# Patient Record
Sex: Male | Born: 1970 | Race: Black or African American | Hispanic: No | Marital: Single | Smoking: Never smoker
Health system: Southern US, Community
[De-identification: ages and names within clinical notes are randomized; demographics above are authoritative.]

---

## 1998-09-04 ENCOUNTER — Emergency Department (HOSPITAL_COMMUNITY): Admission: EM | Admit: 1998-09-04 | Discharge: 1998-09-04 | Payer: Self-pay | Admitting: Emergency Medicine

## 1998-09-04 ENCOUNTER — Encounter: Payer: Self-pay | Admitting: Emergency Medicine

## 2004-02-07 ENCOUNTER — Emergency Department (HOSPITAL_COMMUNITY): Admission: EM | Admit: 2004-02-07 | Discharge: 2004-02-08 | Payer: Self-pay | Admitting: Emergency Medicine

## 2004-09-23 ENCOUNTER — Emergency Department (HOSPITAL_COMMUNITY): Admission: EM | Admit: 2004-09-23 | Discharge: 2004-09-23 | Payer: Self-pay | Admitting: Emergency Medicine

## 2006-02-23 ENCOUNTER — Ambulatory Visit: Payer: Self-pay | Admitting: Internal Medicine

## 2006-02-27 ENCOUNTER — Encounter: Admission: RE | Admit: 2006-02-27 | Discharge: 2006-02-27 | Payer: Self-pay | Admitting: Internal Medicine

## 2006-10-15 ENCOUNTER — Encounter: Payer: Self-pay | Admitting: Emergency Medicine

## 2006-10-15 ENCOUNTER — Inpatient Hospital Stay (HOSPITAL_COMMUNITY): Admission: EM | Admit: 2006-10-15 | Discharge: 2006-10-16 | Payer: Self-pay | Admitting: Emergency Medicine

## 2006-10-21 ENCOUNTER — Emergency Department (HOSPITAL_COMMUNITY): Admission: EM | Admit: 2006-10-21 | Discharge: 2006-10-21 | Payer: Self-pay | Admitting: Emergency Medicine

## 2007-06-05 ENCOUNTER — Ambulatory Visit: Payer: Self-pay | Admitting: Internal Medicine

## 2007-06-05 LAB — CONVERTED CEMR LAB
Bilirubin Urine: NEGATIVE
Blood in Urine, dipstick: NEGATIVE
Glucose, Urine, Semiquant: NEGATIVE
Ketones, urine, test strip: NEGATIVE
Nitrite: NEGATIVE
Protein, U semiquant: NEGATIVE
Specific Gravity, Urine: 1.02
Urobilinogen, UA: 0.2
WBC Urine, dipstick: NEGATIVE
pH: 6.5

## 2007-06-06 ENCOUNTER — Encounter: Payer: Self-pay | Admitting: Internal Medicine

## 2007-06-06 LAB — CONVERTED CEMR LAB
Chlamydia, DNA Probe: NEGATIVE
GC Probe Amp, Genital: NEGATIVE

## 2007-09-06 ENCOUNTER — Ambulatory Visit: Payer: Self-pay | Admitting: Family Medicine

## 2007-09-06 LAB — CONVERTED CEMR LAB
Bilirubin Urine: NEGATIVE
Blood in Urine, dipstick: NEGATIVE
Glucose, Urine, Semiquant: NEGATIVE
Ketones, urine, test strip: NEGATIVE
Nitrite: NEGATIVE
Specific Gravity, Urine: 1.02
Urobilinogen, UA: 2
WBC Urine, dipstick: NEGATIVE
pH: 7.5

## 2008-01-08 ENCOUNTER — Ambulatory Visit: Payer: Self-pay | Admitting: Internal Medicine

## 2008-01-08 LAB — CONVERTED CEMR LAB
Bilirubin Urine: NEGATIVE
Nitrite: NEGATIVE
Protein, U semiquant: NEGATIVE
Urobilinogen, UA: NEGATIVE

## 2009-06-26 ENCOUNTER — Ambulatory Visit: Payer: Self-pay | Admitting: Internal Medicine

## 2009-06-26 DIAGNOSIS — F528 Other sexual dysfunction not due to a substance or known physiological condition: Secondary | ICD-10-CM | POA: Insufficient documentation

## 2009-06-26 LAB — CONVERTED CEMR LAB
Bilirubin Urine: NEGATIVE
Glucose, Urine, Semiquant: NEGATIVE
Protein, U semiquant: 30
Urobilinogen, UA: 0.2
WBC Urine, dipstick: NEGATIVE
pH: 5

## 2009-07-02 ENCOUNTER — Ambulatory Visit: Payer: Self-pay | Admitting: Internal Medicine

## 2009-07-20 ENCOUNTER — Encounter: Payer: Self-pay | Admitting: Internal Medicine

## 2009-12-03 ENCOUNTER — Encounter: Payer: Self-pay | Admitting: Internal Medicine

## 2010-07-22 NOTE — Assessment & Plan Note (Signed)
Summary: pt stuck q-tip in left ear/ear discomfort/trouble hearing/njr   Vital Signs:  Patient profile:   40 year old male Weight:      192 pounds Temp:     99.0 degrees F oral BP sitting:   112 / 90  (left arm) Cuff size:   regular  Vitals Entered By: [name], RN (July 02, 2009 10:23 AM) CC: Stuck Q-tip in L ear yesterday and had sharp pain- this morning ear was bleeding and feels clogged up.   CC:  Stuck Q-tip in L ear yesterday and had sharp pain- this morning ear was bleeding and feels clogged up.Ricardo Mitchell  History of Present Illness: 40 year old patient, who presents today complaining of diminished auditory acuity on the left.  Yesterday he is having some difficult with the left ear and tried to remove some wax with a Q-tip.  Since that time his hearing has diminished on the left  Allergies: No Known Drug Allergies  Past History:  Past Medical History: Reviewed history from 06/05/2007 and no changes required. Unremarkable  Physical Exam  General:  Well-developed,well-nourished,in no acute distress; alert,appropriate and cooperative throughout examination Head:  Normocephalic and atraumatic without obvious abnormalities. No apparent alopecia or balding. Eyes:  No corneal or conjunctival inflammation noted. EOMI. Perrla. Funduscopic exam benign, without hemorrhages, exudates or papilledema. Vision grossly normal. Ears:  left canal occluded with cerumen, and a small amount of dried blood ; Weber did lateralize to the left   Impression & Recommendations:  Problem # 1:  CERUMEN IMPACTION, LEFT (ICD-380.4) irrigated until clear  Patient Instructions: 1)  Please schedule a follow-up appointment as needed.

## 2010-07-22 NOTE — Assessment & Plan Note (Signed)
Summary: URINATING ALOT/NJR   Vital Signs:  Patient profile:   40 year old male Weight:      189 pounds Temp:     98.4 degrees F oral BP sitting:   124 / 88  (left arm) Cuff size:   regular  Vitals Entered By: [name], RN (June 26, 2009 8:40 AM) CC: C/o frequency, esp @noc  and some discomfort inside pelvis.   CC:  C/o frequency and esp @noc  and some discomfort inside pelvis.Ricardo Mitchell  History of Present Illness: 40 year old patient who is seen today for follow-up of his urinary frequency, especially nocturia.  He has empirically been treated for prostatitis in the past.  Last visit, he was given a trial of VESIcare and states that he did quite well for a number of months.  His main complaint is nocturia.  Excess fluid intake or caffeinated products do not seem to be an issue.  There has been no urethral discharge, dysuria, or other complaints.  Urinalyses have been normal.  He is also requesting referral to a urologist after being encouraged by his fiance.  He also describes some mild ED issues.  When evaluated last visit for this same complaint.  Prostate exam was performed and was normal  Allergies: No Known Drug Allergies  Past History:  Past Medical History: Reviewed history from 06/05/2007 and no changes required. Unremarkable  Physical Exam  General:  Well-developed,well-nourished,in no acute distress; alert,appropriate and cooperative throughout examination   Impression & Recommendations:  Problem # 1:  FREQUENCY, URINARY (ICD-788.41)  Orders: UA Dipstick w/o Micro (manual) (04540) Urology Referral (Urology) urinalysis is normal.  Patient may have mild overactive bladder.  VESIcare  was quite helpful in the past.  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  A sample pack of Levitra, dispensed  Patient Instructions: 1)  urology referral as discussed 2)  Please schedule a  follow-up appointment as needed.  Laboratory Results   Urine Tests    Routine Urinalysis   Color: yellow Appearance: Clear Glucose: negative   (Normal Range: Negative) Bilirubin: negative   (Normal Range: Negative) Ketone: negative   (Normal Range: Negative) Spec. Gravity: 1.025   (Normal Range: 1.003-1.035) Blood: trace-intact   (Normal Range: Negative) pH: 5.0   (Normal Range: 5.0-8.0) Protein: 30   (Normal Range: Negative) Urobilinogen: 0.2   (Normal Range: 0-1) Nitrite: negative   (Normal Range: Negative) Leukocyte Esterace: negative   (Normal Range: Negative)

## 2010-07-22 NOTE — Consult Note (Signed)
Summary: [facility]   Imported By: Maryln Gottron 07/24/2009 10:53:28  _____________________________________________________________________  External Attachment:    Type:   Image     Comment:   External Document

## 2010-07-22 NOTE — Letter (Signed)
Summary: [facility]  [facility]   Imported By: Maryln Gottron 12/14/2009 14:55:54  _____________________________________________________________________  External Attachment:    Type:   Image     Comment:   External Document
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
## 2011-07-19 ENCOUNTER — Ambulatory Visit (INDEPENDENT_AMBULATORY_CARE_PROVIDER_SITE_OTHER): Payer: PRIVATE HEALTH INSURANCE | Admitting: Family

## 2011-07-19 ENCOUNTER — Encounter: Payer: Self-pay | Admitting: Family

## 2011-07-19 VITALS — BP 120/80 | Temp 99.8°F | Ht 71.0 in | Wt 179.0 lb

## 2011-07-19 DIAGNOSIS — M5380 Other specified dorsopathies, site unspecified: Secondary | ICD-10-CM

## 2011-07-19 DIAGNOSIS — M5416 Radiculopathy, lumbar region: Secondary | ICD-10-CM

## 2011-07-19 DIAGNOSIS — IMO0002 Reserved for concepts with insufficient information to code with codable children: Secondary | ICD-10-CM

## 2011-07-19 DIAGNOSIS — M47816 Spondylosis without myelopathy or radiculopathy, lumbar region: Secondary | ICD-10-CM

## 2011-07-19 MED ORDER — KETOROLAC TROMETHAMINE 60 MG/2ML IM SOLN
60.0000 mg | Freq: Once | INTRAMUSCULAR | Status: AC
  Administered 2011-07-19: 60 mg via INTRAMUSCULAR

## 2011-07-19 MED ORDER — DICLOFENAC SODIUM 75 MG PO TBEC: 75.0000 mg | DELAYED_RELEASE_TABLET | Freq: Two times a day (BID) | ORAL | Status: DC

## 2011-07-19 MED ORDER — HYDROCODONE-ACETAMINOPHEN 5-500 MG PO TABS: 1.0000 | ORAL_TABLET | Freq: Three times a day (TID) | ORAL | Status: AC | PRN

## 2011-07-19 NOTE — Patient Instructions (Addendum)
Sciatica Sciatica is a weakness and/or changes in sensation (tingling, jolts, hot and cold, numbness) along the path the sciatic nerve travels. Irritation or damage to lumbar nerve roots is often also referred to as lumbar radiculopathy.  Lumbar radiculopathy (Sciatica) is the most common form of this problem. Radiculopathy can occur in any of the nerves coming out of the spinal cord. The problems caused depend on which nerves are involved. The sciatic nerve is the large nerve supplying the branches of nerves going from the hip to the toes. It often causes a numbness or weakness in the skin and/or muscles that the sciatic nerve serves. It also may cause symptoms (problems) of pain, burning, tingling, or electric shock-like feelings in the path of this nerve. This usually comes from injury to the fibers that make up the sciatic nerve. Some of these symptoms are low back pain and/or unpleasant feelings in the following areas:  From the mid-buttock down the back of the leg to the back of the knee.   And/or the outside of the calf and top of the foot.   And/or behind the inner ankle to the sole of the foot.  CAUSES   Herniated or slipped disc. Discs are the little cushions between the bones in the back.   Pressure by the piriformis muscle in the buttock on the sciatic nerve (Piriformis Syndrome).   Misalignment of the bones in the lower back and buttocks (Sacroiliac Joint Derangement).   Narrowing of the spinal canal that puts pressure on or pinches the fibers that make up the sciatic nerve.   A slipped vertebra that is out of line with those above or beneath it.   Abnormality of the nervous system itself so that nerve fibers do not transmit signals properly, especially to feet and calves (neuropathy).   Tumor (this is rare).  Your caregiver can usually determine the cause of your sciatica and begin the treatment most likely to help you. TREATMENT  Taking over-the-counter painkillers, physical  therapy, rest, exercise, spinal manipulation, and injections of anesthetics and/or steroids may be used. Surgery, acupuncture, and Yoga can also be effective. Mind over matter techniques, mental imagery, and changing factors such as your bed, chair, desk height, posture, and activities are other treatments that may be helpful. You and your caregiver can help determine what is best for you. With proper diagnosis, the cause of most sciatica can be identified and removed. Communication and cooperation between your caregiver and you is essential. If you are not successful immediately, do not be discouraged. With time, a proper treatment can be found that will make you comfortable. HOME CARE INSTRUCTIONS   If the pain is coming from a problem in the back, applying ice to that area for 15 to 20 minutes, 3 to 4 times per day while awake, may be helpful. Put the ice in a plastic bag. Place a towel between the bag of ice and your skin.   You may exercise or perform your usual activities if these do not aggravate your pain, or as suggested by your caregiver.   Only take over-the-counter or prescription medicines for pain, discomfort, or fever as directed by your caregiver.   If your caregiver has given you a follow-up appointment, it is very important to keep that appointment. Not keeping the appointment could result in a chronic or permanent injury, pain, and disability. If there is any problem keeping the appointment, you must call back to this facility for assistance.  SEEK IMMEDIATE MEDICAL CARE   IF:   You experience loss of control of bowel or bladder.   You have increasing weakness in the trunk, buttocks, or legs.   There is numbness in any areas from the hip down to the toes.   You have difficulty walking or keeping your balance.   You have any of the above, with fever or forceful vomiting.  Document Released: 05/31/2001 Document Revised: 02/16/2011 Document Reviewed: 01/18/2008 [facility] Patient  Information 2012 [place], [place].  Facet Syndrome Facet syndrome is a condition where injury to the small joints between the bones in the spine (facet joints) causes back pain. Over rotation (twisting) or arching (extension) of the back may injure the joints or the soft disks between the spinal bones. Such injuries result in excessive motion of the facet joint. This causes the cartilage covering the facet joint to wear down. That places pressure on nerves, as they exit the spinal cord.  SYMPTOMS   Chronic dull ache in the low back, that gets worse with over-extension and rotation.   Pain in the low back, buttocks, hip, and sometimes leg.   Sometimes, stiffness of the low back.  CAUSES  Facet syndrome is often caused by repeated or over rotation, over-extension, or extension with rotation of the back. These motions cause injury to the cartilage covering the facet joints. This places pressure on the spinal nerves. RISK INCREASES WITH:  Sports that can cause over-extension of the back, with rotation or repeatedly (golf, football, gymnastics, diving, weight-lifting, dancing, rifle shooting, wrestling, tennis, swimming, volleyball, track and field, rugby, other contact sports).   Poor back strength and flexibility.   Poor exercise technique.  PREVENTION   Learn and use proper technique.   Warm up and stretch properly before activity.   Maintain physical fitness:   Back and hamstring flexibility.   Back muscle strength and endurance.   Cardiovascular fitness.  PROGNOSIS  This condition is often resolved with proper non-surgical treatment.  RELATED COMPLICATIONS   Recurring symptoms, resulting in a chronic problem.   Delayed healing, especially if sports are resumed too soon.   Prolonged impairment.   Narrowed canal for the spinal cord, due to bone spurs (bumps) resulting from chronic erosion of the facet joints (spinal stenosis).  TREATMENT  Treatment first involves stopping  activities that aggravate your symptoms. Ice and medicines may be used to reduce pain and inflammation. Your caregiver may advise strength and stretching activities, to be completed at home or with a therapist. You may be referred to a physical therapist for further treatment, including: ultrasound, manual adjustments, transcutaneous electronic nerve stimulation (TENS). Surgery is rarely needed. It is reserved for athletes with persistent pain, despite 6 to 12 months of proper non-surgical treatment. Surgery involves joining (fusing) two bones of the spinal column, to stop motion between the facet joint and disk. MEDICATION   If pain medicine is needed, nonsteroidal anti-inflammatory medicines (aspirin and ibuprofen), or other minor pain relievers (acetaminophen), are often advised.   Do not take pain medicine for 7 days before surgery.   Stronger pain relievers may be prescribed. Use only as directed and only as much as you need.  HEAT AND COLD  Cold treatment (icing) relieves pain and reduces inflammation. Cold treatment should be applied for 10 to 15 minutes every 2 to 3 hours, and immediately after activity that aggravates your symptoms. Use ice packs or an ice massage.   Heat treatment may be used before performing stretching and strengthening activities advised by your caregiver, physical therapist,  or Event organiser. Use a heat pack or a warm water soak.  SEEK MEDICAL CARE IF:   Symptoms get worse or do not improve in 2 to 4 weeks, despite treatment.   You develop numbness, weakness, or loss of bladder or bowel function.   New, unexplained symptoms develop. (Drugs used in treatment may produce side effects.)  Document Released: 06/06/2005 Document Revised: 02/16/2011 Document Reviewed: 09/18/2008 [facility] Patient Information 2012 [place], [place].

## 2011-07-19 NOTE — Progress Notes (Signed)
  Subjective:    Patient ID: Ricardo Mitchell, male    DOB: 06-24-1970, 41 y.o.   MRN: [id number]  HPI 41 year old [place] American male, for here today with complaint of low back pain that occurred suddenly yesterday while he was sitting at his desk typing. He describes the pain as a constant ache, that is worse with movement. With movement the pain is 8/10. Without movement to 2/10. The pain radiates down into his right. Denies any past medical history of low back pain or injury. He denies any frequency or urgency to urinate, no loss of bowel or bladder control, no fever.   Review of Systems  Respiratory: Negative.   Cardiovascular: Negative.   Gastrointestinal: Negative.   Genitourinary: Negative.   Musculoskeletal: Positive for back pain.       Low back pain that radiates to the right leg.   Neurological: Negative.   Psychiatric/Behavioral: Negative.    No past medical history on file.  History   Social History  . Marital Status: Single    Spouse Name: N/A    Number of Children: N/A  . Years of Education: N/A   Occupational History  . Not on file.   Social History Main Topics  . Smoking status: Never Smoker   . Smokeless tobacco: Not on file  . Alcohol Use: Yes     occassionally  . Drug Use: No  . Sexually Active: Not on file   Other Topics Concern  . Not on file   Social History Narrative  . No narrative on file    No past surgical history on file.  Family History  Problem Relation Age of Onset  . Heart attack Father     No Known Allergies  No current outpatient prescriptions on file prior to visit.   No current facility-administered medications on file prior to visit.    BP 120/80  Temp(Src) 99.8 F (37.7 C) (Oral)  Ht 5\' 11"  (1.803 m)  Wt 179 lb (81.194 kg)  BMI 24.97 kg/m2chart    Objective:   Physical Exam  Constitutional: He is oriented to person, place, and time. He appears well-developed and well-nourished.  Neck: Normal range of  motion. Neck supple.  Cardiovascular: Normal rate, regular rhythm and normal heart sounds.   Pulmonary/Chest: Effort normal and breath sounds normal.  Abdominal: Soft. Bowel sounds are normal.  Musculoskeletal: He exhibits no edema and no tenderness.       No pain to palpation of the L-spine. Pain elicited with left lateral movement. No pain with right lateral movement. Negative straight leg raise maneuver. Pedal pulses 2 out of 2  Neurological: He is alert and oriented to person, place, and time.  Skin: Skin is warm and dry.  Psychiatric: He has a normal mood and affect.          Assessment & Plan:  Assessment: Lumbar radiculopathy, acute facet syndrome  Plan: Toradol 60 mg IM x1 given. Vicodin 5/500 one tablet every 8 hours when necessary pain. Warned of drowsiness. Both parents 75 mg one tablet twice a day with food. Heating pad to the affected area. Call the office if symptoms worsen or persist. Check a schedule, and when necessary.

## 2012-03-25 ENCOUNTER — Encounter (HOSPITAL_COMMUNITY): Payer: Self-pay

## 2012-03-25 ENCOUNTER — Emergency Department (HOSPITAL_COMMUNITY)
Admission: EM | Admit: 2012-03-25 | Discharge: 2012-03-25 | Disposition: A | Discharge status: Home / Self Care | Payer: Self-pay | Attending: Emergency Medicine | Admitting: Emergency Medicine

## 2012-03-25 DIAGNOSIS — R319 Hematuria, unspecified: Secondary | ICD-10-CM | POA: Insufficient documentation

## 2012-03-25 LAB — URINALYSIS, ROUTINE W REFLEX MICROSCOPIC
Nitrite: NEGATIVE
Specific Gravity, Urine: 1.039 — ABNORMAL HIGH (ref 1.005–1.030)
pH: 5.5 (ref 5.0–8.0)

## 2012-03-25 LAB — URINE MICROSCOPIC-ADD ON

## 2012-03-25 LAB — CK: Total CK: 281 U/L — ABNORMAL HIGH (ref 7–232)

## 2012-03-25 LAB — POCT I-STAT, CHEM 8
Calcium, Ion: 1.16 mmol/L (ref 1.12–1.23)
Glucose, Bld: 119 mg/dL — ABNORMAL HIGH (ref 70–99)
HCT: 46 % (ref 39.0–52.0)
Hemoglobin: 15.6 g/dL (ref 13.0–17.0)

## 2012-03-25 LAB — CBC
Hemoglobin: 14.9 g/dL (ref 13.0–17.0)
RBC: 4.84 MIL/uL (ref 4.22–5.81)

## 2012-03-25 MED ORDER — CIPROFLOXACIN HCL 500 MG PO TABS: 500.0000 mg | ORAL_TABLET | Freq: Two times a day (BID) | ORAL | Status: DC

## 2012-03-25 NOTE — ED Notes (Signed)
Pt states that he noticed blood in his urine that started today. Pt denies hx of same symptoms. Pt states some clots as well for his urine. Pt denies discharge, pt burning or frequency.

## 2012-03-25 NOTE — ED Provider Notes (Signed)
History     CSN: [id number]  Arrival date & time 03/25/12  4098   First MD Initiated Contact with Patient 03/25/12 0413      Chief Complaint  Patient presents with  . Hematuria    (Consider location/radiation/quality/duration/timing/severity/associated sxs/prior treatment) Patient is a 41 y.o. male presenting with hematuria.  Hematuria Pertinent negatives include no abdominal pain, chills, dysuria or fever.   Hx per PT. Onset this am. No dysuria, no testicle pain, no flank pain. No h/o same. Is sexually active. No rash or lesion or discharge. No F/C. No N/V/D. No ABD pain. Multiple episodes of hematuria unchanged.  Moderate in severity. Non smoker, no medications, no FH of same.  History reviewed. No pertinent past medical history.  History reviewed. No pertinent past surgical history.  Family History  Problem Relation Age of Onset  . Heart attack Father     History  Substance Use Topics  . Smoking status: Never Smoker   . Smokeless tobacco: Not on file  . Alcohol Use: Yes     occassionally      Review of Systems  Constitutional: Negative for fever and chills.  HENT: Negative for neck pain and neck stiffness.   Eyes: Negative for pain.  Respiratory: Negative for shortness of breath.   Cardiovascular: Negative for chest pain.  Gastrointestinal: Negative for abdominal pain.  Genitourinary: Positive for hematuria. Negative for dysuria.  Musculoskeletal: Negative for back pain.  Skin: Negative for rash.  Neurological: Negative for headaches.  All other systems reviewed and are negative.    Allergies  Review of patient's allergies indicates no known allergies.  Home Medications  No current outpatient prescriptions on file.  BP 144/87  Pulse 97  Temp 98.8 F (37.1 C) (Oral)  SpO2 96%  Physical Exam  Constitutional: He is oriented to person, place, and time. He appears well-developed and well-nourished.  HENT:  Head: Normocephalic and atraumatic.    Mouth/Throat: No oropharyngeal exudate.  Eyes: EOM are normal. Pupils are equal, round, and reactive to light.  Neck: Neck supple.  Cardiovascular: Normal rate, regular rhythm and intact distal pulses.   Pulmonary/Chest: Effort normal and breath sounds normal. No respiratory distress.  Abdominal: Soft. Bowel sounds are normal. He exhibits no distension. There is no tenderness. There is no rebound and no guarding.       No CVAT  Musculoskeletal: Normal range of motion. He exhibits no edema and no tenderness.  Neurological: He is alert and oriented to person, place, and time.  Skin: Skin is warm and dry.    ED Course  Procedures (including critical care time)  Results for orders placed during the hospital encounter of 03/25/12  URINALYSIS, ROUTINE W REFLEX MICROSCOPIC      Component Value Range   Color, Urine AMBER (*) YELLOW   APPearance TURBID (*) CLEAR   Specific Gravity, Urine 1.039 (*) 1.005 - 1.030   pH 5.5  5.0 - 8.0   Glucose, UA NEGATIVE  NEGATIVE mg/dL   Hgb urine dipstick SMALL (*) NEGATIVE   Bilirubin Urine NEGATIVE  NEGATIVE   Ketones, ur NEGATIVE  NEGATIVE mg/dL   Protein, ur NEGATIVE  NEGATIVE mg/dL   Urobilinogen, UA 1.0  0.0 - 1.0 mg/dL   Nitrite NEGATIVE  NEGATIVE   Leukocytes, UA TRACE (*) NEGATIVE  CBC      Component Value Range   WBC 6.5  4.0 - 10.5 K/uL   RBC 4.84  4.22 - 5.81 MIL/uL   Hemoglobin 14.9  13.0 -  17.0 g/dL   HCT 08.6  57.8 - 46.9 %   MCV 89.7  78.0 - 100.0 fL   MCH 30.8  26.0 - 34.0 pg   MCHC 34.3  30.0 - 36.0 g/dL   RDW 62.9  52.8 - 41.3 %   Platelets 247  150 - 400 K/uL  CK      Component Value Range   Total CK 281 (*) 7 - 232 U/L  URINE MICROSCOPIC-ADD ON      Component Value Range   Squamous Epithelial / LPF FEW (*) RARE   WBC, UA 3-6  <3 WBC/hpf   RBC / HPF 3-6  <3 RBC/hpf   Bacteria, UA FEW (*) RARE   Urine-Other MUCOUS PRESENT    POCT I-STAT, CHEM 8      Component Value Range   Sodium 143  135 - 145 mEq/L   Potassium 3.4  (*) 3.5 - 5.1 mEq/L   Chloride 104  96 - 112 mEq/L   BUN 11  6 - 23 mg/dL   Creatinine, Ser 2.44  0.50 - 1.35 mg/dL   Glucose, Bld 010 (*) 70 - 99 mg/dL   Calcium, Ion 2.72  5.36 - 1.23 mmol/L   TCO2 24  0 - 100 mmol/L   Hemoglobin 15.6  13.0 - 17.0 g/dL   HCT 64.4  03.4 - 74.2 %   Painless hematuria possible infection - plan ABx and close outpatient follow up. U Cx ordered/ pending.   MDM   Hematuria with UA reviewed as above - bacteria and a few WBCs, no sig RBCs. PT denies any testicle, GU, flank, back or ABD pain. No fevers or h/o prostate problems. Rx cipro. Infection precautions verbalized as understood.         Sunnie Nielsen, MD 03/26/12 [id number]

## 2012-03-25 NOTE — ED Notes (Signed)
Pt complains of blood in urine onset today no associtated pain

## 2012-03-26 LAB — URINE CULTURE: Colony Count: NO GROWTH

## 2012-07-06 ENCOUNTER — Ambulatory Visit (INDEPENDENT_AMBULATORY_CARE_PROVIDER_SITE_OTHER): Payer: BC Managed Care – PPO | Admitting: Internal Medicine

## 2012-07-06 ENCOUNTER — Encounter: Payer: Self-pay | Admitting: Internal Medicine

## 2012-07-06 VITALS — BP 150/88 | HR 76 | Temp 98.7°F | Resp 16 | Wt 197.0 lb

## 2012-07-06 DIAGNOSIS — M25562 Pain in left knee: Secondary | ICD-10-CM

## 2012-07-06 DIAGNOSIS — M25569 Pain in unspecified knee: Secondary | ICD-10-CM

## 2012-07-06 MED ORDER — CELECOXIB 200 MG PO CAPS: 200.0000 mg | ORAL_CAPSULE | Freq: Two times a day (BID) | ORAL | Status: DC

## 2012-07-06 NOTE — Progress Notes (Signed)
  Subjective:    Patient ID: Ricardo Mitchell, male    DOB: 1971-03-19, 42 y.o.   MRN: [id number]  HPI  42 year old patient who presents with a one-month history of left knee pain and swelling. There has been no obvious trauma. Earlier the patient used Aleve with a very nice clinical response his pain level decreased from 3 down to level I with much improvement of the swelling. At the present time he complains of some mild discomfort and knee tightness. No unusual activities and no history of trauma. No prior history of arthritis or joint pain. He takes no chronic medications.  No past medical history on file.  History   Social History  . Marital Status: Single    Spouse Name: N/A    Number of Children: N/A  . Years of Education: N/A   Occupational History  . Not on file.   Social History Main Topics  . Smoking status: Never Smoker   . Smokeless tobacco: Not on file  . Alcohol Use: Yes     Comment: occassionally  . Drug Use: No  . Sexually Active: Not on file   Other Topics Concern  . Not on file   Social History Narrative  . No narrative on file    No past surgical history on file.  Family History  Problem Relation Age of Onset  . Heart attack Father     No Known Allergies  No current outpatient prescriptions on file prior to visit.    BP 150/88  Pulse 76  Temp 98.7 F (37.1 C) (Oral)  Resp 16  Wt 197 lb (89.359 kg)       Review of Systems  Musculoskeletal: Positive for joint swelling and arthralgias.       Objective:   Physical Exam  Constitutional: He appears well-developed and well-nourished. No distress.       Afebrile  Musculoskeletal:        The left knee was slightly warm to touch with a mild effusion. There is mild tenderness along the lateral joint line          Assessment & Plan:   Left knee pain. Concern about a possible left lateral meniscal tear. The patient has had a very nice response to Aleve;  will treat with Celebrex for 2  weeks and observe. If pain and swelling continues to be an issue or in any way affects his level of activities, he has been instructed to call the office for orthopedic referral

## 2012-07-06 NOTE — Patient Instructions (Addendum)
You  may move around, but avoid painful motions and activities.  Apply ice to the sore area for 15 to 20 minutes  after vigorous  Activities  Call or return to clinic prn if these symptoms worsen or fail to improve as anticipated.

## 2012-08-04 ENCOUNTER — Other Ambulatory Visit: Payer: Self-pay

## 2013-01-14 ENCOUNTER — Encounter: Payer: Self-pay | Admitting: Family Medicine

## 2013-01-14 ENCOUNTER — Ambulatory Visit (INDEPENDENT_AMBULATORY_CARE_PROVIDER_SITE_OTHER): Payer: BC Managed Care – PPO | Admitting: Family Medicine

## 2013-01-14 VITALS — BP 130/86 | Temp 99.0°F | Wt 197.0 lb

## 2013-01-14 DIAGNOSIS — R319 Hematuria, unspecified: Secondary | ICD-10-CM

## 2013-01-14 DIAGNOSIS — Z23 Encounter for immunization: Secondary | ICD-10-CM

## 2013-01-14 DIAGNOSIS — R310 Gross hematuria: Secondary | ICD-10-CM

## 2013-01-14 LAB — CBC WITH DIFFERENTIAL/PLATELET
Eosinophils Relative: 1.2 % (ref 0.0–5.0)
HCT: 46.7 % (ref 39.0–52.0)
Lymphocytes Relative: 55.7 % — ABNORMAL HIGH (ref 12.0–46.0)
Lymphs Abs: 2.5 10*3/uL (ref 0.7–4.0)
Monocytes Relative: 9.7 % (ref 3.0–12.0)
Neutrophils Relative %: 32.9 % — ABNORMAL LOW (ref 43.0–77.0)
Platelets: 234 10*3/uL (ref 150.0–400.0)
WBC: 4.4 10*3/uL — ABNORMAL LOW (ref 4.5–10.5)

## 2013-01-14 LAB — POCT URINALYSIS DIPSTICK
Ketones, UA: NEGATIVE
Leukocytes, UA: NEGATIVE
Protein, UA: NEGATIVE

## 2013-01-14 LAB — BASIC METABOLIC PANEL
BUN: 9 mg/dL (ref 6–23)
CO2: 27 mEq/L (ref 19–32)
Calcium: 9.1 mg/dL (ref 8.4–10.5)
Creatinine, Ser: 1.3 mg/dL (ref 0.4–1.5)
Glucose, Bld: 89 mg/dL (ref 70–99)

## 2013-01-14 NOTE — Patient Instructions (Signed)
-  We have ordered labs or studies at this visit. It can take up to 1-2 weeks for results and processing. We will contact you with instructions IF your results are abnormal. Normal results will be released to your [facility]. If you have not heard from [place] or can not find your results in [facility] in 2 weeks please contact our office.  --We placed a referral for you as discussed to the urologist. It usually takes about 1-2 weeks to process and schedule this referral. If you have not heard from [place] regarding this appointment in 2 weeks please contact our office.  -follow up as needed

## 2013-01-14 NOTE — Progress Notes (Signed)
Chief Complaint  Patient presents with  . Hematuria    HPI:  42 yo M pt of Dr. Cato Mulligan here for hematuria: -gross hematuria - small amount this am -has notice darker urine in the past on and off -urine dip with blood, otherwise negative -per review of chart hx of painless hematuria in 10/13 and seen in ED and tx fr possible infection - urine culture was negative -denies:pain, flank pain, nausea, vomiting, frequency, urgency, testicular pain, dysuria, fevers, weight loss -no strenuous exercise, no FH or personal hx kidney stones or kidney problems, reports brother has bladder cancer and he is worried about this and wants to see a urologist -he has no smoking history -not taking any medications  ROS: See pertinent positives and negatives per HPI.  No past medical history on file.  Family History  Problem Relation Age of Onset  . Heart attack Father     History   Social History  . Marital Status: Single    Spouse Name: N/A    Number of Children: N/A  . Years of Education: N/A   Social History Main Topics  . Smoking status: Never Smoker   . Smokeless tobacco: None  . Alcohol Use: Yes     Comment: occassionally  . Drug Use: No  . Sexually Active: None   Other Topics Concern  . None   Social History Narrative  . None    Current outpatient prescriptions:celecoxib (CELEBREX) 200 MG capsule, Take 1 capsule (200 mg total) by mouth 2 (two) times daily., Disp: , Rfl:   EXAM:  Filed Vitals:   01/14/13 0919  BP: 130/86  Temp: 99 F (37.2 C)    Body mass index is 27.49 kg/(m^2).  GENERAL: vitals reviewed and listed above, alert, oriented, appears well hydrated and in no acute distress  HEENT: atraumatic, conjunttiva clear, no obvious abnormalities on inspection of external nose and ears  NECK: no obvious masses on inspection  LUNGS: clear to auscultation bilaterally, no wheezes, rales or rhonchi, good air movement  CV: HRRR, no peripheral edema  GU: normal  appearance of testicles, penis - no lesions, masses or tenderness, he deferred rectal exam and will see urologist for this  ABD: soft, NTTP, no CVA TTP  MS: moves all extremities without noticeable abnormality  PSYCH: pleasant and cooperative, no obvious depression or anxiety  ASSESSMENT AND PLAN:  Discussed the following assessment and plan:  Hematuria - Plan: POCT urinalysis dipstick, CBC with Differential, Ambulatory referral to Urology, Urine Microscopic Only, Culture, Urine, Basic metabolic panel  Need for prophylactic vaccination with combined diphtheria-tetanus-pertussis (DTP) vaccine - Plan: Tdap vaccine greater than or equal to 7yo IM  Gross hematuria - Plan: CBC with Differential, Ambulatory referral to Urology, Urine Microscopic Only, Culture, Urine, Basic metabolic panel  -painless, recurrent gross hematuria and dip with 3+ blood and otherwise negative -discussed potential implications/etiologies and hewould like to see urology - referral placed -Recommendations per orders and instructions, risks and  and return precautions discussed.  -Patient advised to return or notify a doctor immediately if symptoms worsen or persist or new concerns arise.  There are no Patient Instructions on file for this visit.   Kriste Basque R.

## 2013-01-15 LAB — URINALYSIS, MICROSCOPIC ONLY
Casts: NONE SEEN
Crystals: NONE SEEN
RBC / HPF: >50 RBC/hpf — AB (ref <3)
Squamous Epithelial / LPF: NONE SEEN

## 2013-01-15 NOTE — Progress Notes (Signed)
Quick Note:  Called and spoke with pt and pt is aware. ______ 

## 2013-01-16 NOTE — Progress Notes (Signed)
Quick Note:  Called and spoke with pt and pt is aware. ______ 

## 2013-04-25 ENCOUNTER — Other Ambulatory Visit: Payer: Self-pay

## 2013-10-28 ENCOUNTER — Emergency Department (HOSPITAL_COMMUNITY): Payer: 59

## 2013-10-28 ENCOUNTER — Telehealth: Payer: Self-pay | Admitting: Internal Medicine

## 2013-10-28 ENCOUNTER — Emergency Department (HOSPITAL_COMMUNITY)
Admission: EM | Admit: 2013-10-28 | Discharge: 2013-10-28 | Disposition: A | Discharge status: Home / Self Care | Payer: 59 | Attending: Emergency Medicine | Admitting: Emergency Medicine

## 2013-10-28 ENCOUNTER — Encounter (HOSPITAL_COMMUNITY): Payer: Self-pay | Admitting: Emergency Medicine

## 2013-10-28 DIAGNOSIS — R6884 Jaw pain: Secondary | ICD-10-CM | POA: Insufficient documentation

## 2013-10-28 DIAGNOSIS — Z9889 Other specified postprocedural states: Secondary | ICD-10-CM | POA: Insufficient documentation

## 2013-10-28 DIAGNOSIS — R259 Unspecified abnormal involuntary movements: Secondary | ICD-10-CM | POA: Insufficient documentation

## 2013-10-28 DIAGNOSIS — K089 Disorder of teeth and supporting structures, unspecified: Secondary | ICD-10-CM | POA: Insufficient documentation

## 2013-10-28 MED ORDER — KETOROLAC TROMETHAMINE 30 MG/ML IJ SOLN
30.0000 mg | Freq: Once | INTRAMUSCULAR | Status: AC
  Administered 2013-10-28: 30 mg via INTRAMUSCULAR
  Filled 2013-10-28: qty 1

## 2013-10-28 MED ORDER — DIAZEPAM 5 MG/ML IJ SOLN
5.0000 mg | Freq: Once | INTRAMUSCULAR | Status: AC
  Administered 2013-10-28: 5 mg via INTRAMUSCULAR
  Filled 2013-10-28: qty 2

## 2013-10-28 MED ORDER — DIAZEPAM 5 MG PO TABS: 5.0000 mg | ORAL_TABLET | Freq: Two times a day (BID) | ORAL | Status: DC

## 2013-10-28 MED ORDER — HYDROCODONE-ACETAMINOPHEN 5-325 MG PO TABS: 1.0000 | ORAL_TABLET | Freq: Four times a day (QID) | ORAL | Status: DC | PRN

## 2013-10-28 NOTE — ED Provider Notes (Signed)
CSN: [id number]     Arrival date & time 10/28/13  1142 History  This chart was scribed for non-physician practitioner working with Glynn OctaveStephen Rancour, MD, by Jarvis Morganaylor Ferguson, ED Scribe. This patient was seen in room TR08C/TR08C and the patient's care was started at 12:19 PM.    Chief Complaint  Patient presents with  . Jaw Pain    The history is provided by the patient. No language interpreter was used.   HPI Comments: Ricardo Mitchell is a 43 y.o. male who presents to the Emergency Department complaining of waxing and waning, "2/10", tight, jaw pain. Pt had a root canal procedure performed on 10/08/13 and on 10/11/13 he states that he noticed difficulty opening his mouth accompanied by jaw pain. Patient states that his mouth was open for 2 hours during the procedure. Patient states that he followed up with his dentist on 10/16/13 and has been given different rounds of antibiotics, muscle relaxers and antiinflammatories with minimal relief. Pain reports that the pain exacerbated by trying to open his mouth. Patient notes that he has been unable to eat any solid foods and has "lost 9 pounds" since the procedure. Patient has no history of cardiac problems. Patient last T-dap was administered on 01/14/13. Patient denies any drainage from teeth, fever, chest pain, dyspnea, or trouble swallowing.   History reviewed. No pertinent past medical history. History reviewed. No pertinent past surgical history. Family History  Problem Relation Age of Onset  . Heart attack Father    History  Substance Use Topics  . Smoking status: Never Smoker   . Smokeless tobacco: Not on file  . Alcohol Use: Yes     Comment: occassionally    Review of Systems  Constitutional: Negative for fever. Appetite change: unable to eat solids.  HENT: Positive for dental problem (jaw pain and trouble opening mouth). Negative for sore throat, trouble swallowing and voice change.   Respiratory: Negative for shortness of breath.    Cardiovascular: Negative for chest pain.  All other systems reviewed and are negative.     Allergies  Review of patient's allergies indicates no known allergies.  Home Medications   Prior to Admission medications   Not on File   Triage Vitals: BP 128/99  Pulse 97  Temp(Src) 99.3 F (37.4 C) (Oral)  Resp 16  SpO2 97%  Physical Exam  Constitutional: He is oriented to person, place, and time. He appears well-developed and well-nourished. No distress.  HENT:  Head: Normocephalic and atraumatic.  Right Ear: Hearing, tympanic membrane, external ear and ear canal normal.  Left Ear: Hearing, tympanic membrane, external ear and ear canal normal.  Nose: Nose normal.  Mouth/Throat: Uvula is midline, oropharynx is clear and moist and mucous membranes are normal. No oral lesions. There is trismus in the jaw. No dental abscesses or uvula swelling.  Left TMJ clicks when attempting to open jaw. No gross bony deformity noted. No submental or sublingual swelling or edema.    Eyes: Conjunctivae are normal.  Neck: Normal range of motion. Neck supple.  Cardiovascular: Normal rate, regular rhythm and normal heart sounds.   Pulmonary/Chest: Effort normal and breath sounds normal.  Lymphadenopathy:    He has no cervical adenopathy.  Neurological: He is alert and oriented to person, place, and time.  Skin: Skin is warm and dry. He is not diaphoretic.  Psychiatric: He has a normal mood and affect.    ED Course  Procedures (including critical care time) Medications  ketorolac (TORADOL) 30 MG/ML injection  30 mg (30 mg Intramuscular Given 10/28/13 1345)  diazepam (VALIUM) injection 5 mg (5 mg Intramuscular Given 10/28/13 1345)     DIAGNOSTIC STUDIES: Oxygen Saturation is 97% on RA, adequate by my interpretation.    COORDINATION OF CARE:    Labs Review Labs Reviewed - No data to display  Imaging Review Ct Maxillofacial Wo Cm  10/28/2013   CLINICAL DATA:  Jaw pain  EXAM: CT  MAXILLOFACIAL WITHOUT CONTRAST  TECHNIQUE: Multidetector CT imaging of the maxillofacial structures was performed. Multiplanar CT image reconstructions were also generated. A small metallic BB was placed on the right temple in order to reliably differentiate right from left.  COMPARISON:  10/21/2006  FINDINGS: The mandible is intact. The temporomandibular joints are located. No evidence for maxillary sinus fracture. The nasal bones are intact. The zygomatic arches are normal. No air-fluid levels in the paranasal sinuses. No fluid in the mastoid air cells or middle ears. Small focus of polypoid mucosal disease is identified in the right maxillary sinus.  The medial and inferior orbital walls are intact.  IMPRESSION: No acute findings. No evidence to explain the patient's history of jaw pain.   Electronically Signed   By: Kennith CenterEric  Mansell M.D.   On: 10/28/2013 15:23     EKG Interpretation None      MDM   Final diagnoses:  Jaw pain   Filed Vitals:   10/28/13 1536  BP: 128/84  Pulse: 80  Temp: 98.4 F (36.9 C)  Resp: 18   Afebrile, NAD, non-toxic appearing, AAOx4.   Discussed with Dr. Manus Gunningancour who recommends CT maxillofacial for evaluation of trismus.   Patient is a 43 year old male presents to the emergency department for left-sided jaw pain since April 22nd after having a dental procedure done. Lungs are clear on auscultation. Left TMJ clicks and is tender to palpation when patient attempts to open his mouth. There is no sublingual or submental swelling or induration noted. No obvious dental abscess or gross oropharyngeal swelling appreciated. CT maxillofacial obtained without acute finding. Patient endorses improvement after IM Valium and Toradol. Advised he should followup with his dentist sooner than scheduled follow up appointment if at all possible. Return precautions discussed. Patient is agreeable to plan. Patient is stable at time of discharge. Patient d/w with Dr. Manus Gunningancour, agrees with  plan.    I personally performed the services described in this documentation, which was scribed in my presence. The recorded information has been reviewed and is accurate.     Ricardo EllisJennifer L Antoria Lanza, PA-C 10/28/13 1619

## 2013-10-28 NOTE — Telephone Encounter (Signed)
Patient Information:  Caller Name: Ethelene Brownsnthony  Phone: [phone]  Patient: Ricardo Mitchell, Ricardo Mitchell Doctornthony R  Gender: Male  DOB: 01-04-1971  Age: 43 Years  PCP: Eleonore ChiquitoKwiatkowski, Peter (Family Practice > 45yrs old)  Office Follow Up:  Does the office need to follow up with this patient?: No  Instructions For The Office: N/A  RN Note:  Triaged per Teeth and Jaw Symptoms Guideline ([facility]); See in ED Immed due to unable to open/close mouth fully; office called and instructed to send to ED; pt [facility] comply [facility]  Symptoms  Reason For Call & Symptoms: Pt is calling and states that he had a root canal 3 weeks ago and mouth was opened for 2 hours for proceedure and now he is having jaw spasms on the left side;  sx started approx 2 1/2 weeks ago; have not been able to open mouth for 2 1/2 weeks; saw dentist about this and a muscle relaxer and pain pill was given but did not help;  lost 8lb since this has occurred;  eating smoothies for nutritions; seeing chiropractor for this and may have helped some but hard to tell;  Reviewed Health History In EMR: Yes  Reviewed Medications In EMR: Yes  Reviewed Allergies In EMR: Yes  Reviewed Surgeries / Procedures: Yes  Date of Onset of Symptoms: 10/09/2013  Treatments Tried: heating pad; muscle relaxer; pain meds; chiropractor  Treatments Tried Worked: No  Guideline(s) Used:  No Protocol Available - Sick Adult  Disposition Per Guideline:   Go to ED Now  Reason For Disposition Reached:   Nursing judgment  Advice Given:  Call Back If:  New symptoms develop  You become worse.  Patient Will Follow Care Advice:  YES

## 2013-10-28 NOTE — ED Notes (Signed)
Per pt sts that he had a dental procedure on April the 21st and since as had issues opening his mouth. sts that his mouth was open for about 2 hours for the procedure. sts he has been taking anti inflammatory and abx. sts only painful when he tried to open his mouth.

## 2013-10-28 NOTE — Discharge Instructions (Signed)
Please follow up with your primary care physician in 1-2 days. If you do not have one please call the [facility] number listed above. Please take pain medication and/or muscle relaxants as prescribed and as needed for pain. Please do not drive on narcotic pain medication or on muscle relaxants. Please rotate use of these medications, please do not take them at the same time. Please read all discharge instructions and return precautions.   Temporomandibular Problems  Temporomandibular joint (TMJ) dysfunction means there are problems with the joint between your jaw and your skull. This is a joint lined by cartilage like other joints in your body but also has a small disc in the joint which keeps the bones from rubbing on each other. These joints are like other joints and can get inflamed (sore) from arthritis and other problems. When this joint gets sore, it can cause headaches and pain in the jaw and the face. CAUSES  Usually the arthritic types of problems are caused by soreness in the joint. Soreness in the joint can also be caused by overuse. This may come from grinding your teeth. It may also come from mis-alignment in the joint. DIAGNOSIS Diagnosis of this condition can often be made by history and exam. Sometimes your caregiver may need X-rays or an MRI scan to determine the exact cause. It may be necessary to see your dentist to determine if your teeth and jaws are lined up correctly. TREATMENT  Most of the time this problem is not serious; however, sometimes it can persist (become chronic). When this happens medications that will cut down on inflammation (soreness) help. Sometimes a shot of cortisone into the joint will be helpful. If your teeth are not aligned it may help for your dentist to make a splint for your mouth that can help this problem. If no physical problems can be found, the problem may come from tension. If tension is found to be the cause, biofeedback or  relaxation techniques may be helpful. HOME CARE INSTRUCTIONS   Later in the day, applications of ice packs may be helpful. Ice can be used in a plastic bag with a towel around it to prevent frostbite to skin. This may be used about every 2 hours for 20 to 30 minutes, as needed while awake, or as directed by your caregiver.  Only take over-the-counter or prescription medicines for pain, discomfort, or fever as directed by your caregiver.  If physical therapy was prescribed, follow your caregiver's directions.  Wear mouth appliances as directed if they were given. Document Released: 03/01/2001 Document Revised: 08/29/2011 Document Reviewed: 06/08/2008 [facility] Patient Information 2014 [place] [place].

## 2013-10-28 NOTE — ED Provider Notes (Addendum)
Medical screening examination/treatment/procedure(s) were conducted as a shared visit with non-physician practitioner(s) and myself.  I personally evaluated the patient during the encounter.  L jaw pain after dental procedure 4/21. No fever, drooling, voice change.  Trismus on exam, floor of mouth soft, no tongue elevation. Pain at TMJ with attempted opening with masseter spasm. Tetanus up to date.   Symptoms started after prolonged mouth opening for dental procedure. Improvement with benzos in ED. No TMJ dislocation on CT. Tetanus considered but seems less likely given time course and correlation with dental work.  Addendum 10/30/13 9:00 am: called patient. Symptoms have improved with meds and ROM of jaw is improved. Advised patient to have recheck before end of week with PCP or ED to ensure ROM of jaw continues to improve and no other muscle groups affected.   EKG Interpretation None       Glynn OctaveStephen Kaena Santori, MD 10/28/13 1803  Glynn OctaveStephen Alucard Fearnow, MD 10/30/13 [phone]

## 2013-10-31 ENCOUNTER — Ambulatory Visit (INDEPENDENT_AMBULATORY_CARE_PROVIDER_SITE_OTHER): Payer: 59 | Admitting: Family Medicine

## 2013-10-31 ENCOUNTER — Encounter: Payer: Self-pay | Admitting: Family Medicine

## 2013-10-31 VITALS — BP 112/72 | HR 88 | Temp 98.4°F | Ht 71.0 in | Wt 195.0 lb

## 2013-10-31 DIAGNOSIS — M26629 Arthralgia of temporomandibular joint, unspecified side: Secondary | ICD-10-CM

## 2013-10-31 DIAGNOSIS — M2669 Other specified disorders of temporomandibular joint: Secondary | ICD-10-CM

## 2013-10-31 MED ORDER — DIAZEPAM 5 MG PO TABS: 5.0000 mg | ORAL_TABLET | Freq: Two times a day (BID) | ORAL | Status: DC

## 2013-10-31 NOTE — Progress Notes (Signed)
Pre visit review using our clinic review tool, if applicable. No additional management support is needed unless otherwise documented below in the visit note. 

## 2013-10-31 NOTE — Progress Notes (Signed)
No chief complaint on file.   HPI:  Follow up TMJ: -jaw pain and difficulty opening jaw after dental work 4/22  Dentist treated with abx and muscle relaxers -seen in ED 5/11 - CT maxillofacial ok and given valium and toradol and told to follow up with dentist, instead presents here today and PCP unavailable -reports:  Has appointment with dentist, doing a little better - taking valium bid and this is working - wants 4 days more to get to dental appointment -denies swallowing, trouble, oral pain, fevers, trouble breathing  ROS: See pertinent positives and negatives per HPI.  No past medical history on file.  No past surgical history on file.  Family History  Problem Relation Age of Onset  . Heart attack Father     History   Social History  . Marital Status: Single    Spouse Name: N/A    Number of Children: N/A  . Years of Education: N/A   Social History Main Topics  . Smoking status: Never Smoker   . Smokeless tobacco: None  . Alcohol Use: Yes     Comment: occassionally  . Drug Use: No  . Sexual Activity: None   Other Topics Concern  . None   Social History Narrative  . None    Current outpatient prescriptions:diazepam (VALIUM) 5 MG tablet, Take 1 tablet (5 mg total) by mouth 2 (two) times daily., Disp: 10 tablet, Rfl: 0;  etodolac (LODINE) 400 MG tablet, Take 400 mg by mouth every 6 (six) hours as needed for mild pain., Disp: , Rfl:   EXAM:  Filed Vitals:   10/31/13 0945  BP: 112/72  Pulse: 88  Temp: 98.4 F (36.9 C)    Body mass index is 27.21 kg/(m^2).  GENERAL: vitals reviewed and listed above, alert, oriented, appears well hydrated and in no acute distress  HEENT: atraumatic, conjunttiva clear, no obvious abnormalities on inspection of external nose and ears  NECK: no obvious masses on inspection  MS: moves all extremities without noticeable abnormality - muscles of jaw mildly tender, able to open mouth without creptis but some decreased ROM, no  clicking or popping of jaw  PSYCH: pleasant and cooperative, no obvious depression or anxiety  ASSESSMENT AND PLAN:  Discussed the following assessment and plan:  TMJ syndrome - Plan: diazepam (VALIUM) 5 MG tablet  -tramadol, heat, muscle relaxer after discussion risks -he has follow up with his dentist and will determine if needs oral fascial specialist -Patient advised to return or notify a doctor immediately if symptoms worsen or persist or new concerns arise.  There are no Patient Instructions on file for this visit.   Terressa KoyanagiHannah R. Danilo Cappiello

## 2013-10-31 NOTE — Patient Instructions (Signed)
-  see dentist  -muscle relaxer sparingly as needed  -exercises provided  -follow up as needed

## 2014-01-29 ENCOUNTER — Encounter: Payer: Self-pay | Admitting: Family Medicine

## 2014-01-29 ENCOUNTER — Ambulatory Visit (INDEPENDENT_AMBULATORY_CARE_PROVIDER_SITE_OTHER): Payer: 59 | Admitting: Family Medicine

## 2014-01-29 VITALS — BP 133/77 | HR 84 | Temp 98.5°F | Ht 71.0 in | Wt 198.0 lb

## 2014-01-29 DIAGNOSIS — N63 Unspecified lump in unspecified breast: Secondary | ICD-10-CM

## 2014-01-29 NOTE — Progress Notes (Signed)
Pre visit review using our clinic review tool, if applicable. No additional management support is needed unless otherwise documented below in the visit note. 

## 2014-01-29 NOTE — Progress Notes (Signed)
   Subjective:    Patient ID: Ricardo Mitchell, male    DOB: 17-Nov-1970, 43 y.o.   MRN: [id number]  HPI Here for discomfort in the left breast that started 4 weeks ago. He says he has had some lumps in both breasts since he was a young child and in his early teenage years he saw a doctor about these. He was told they were nothing to worry about. Then in 2007 he developed some soreness in the right breast and was sent for a mammogram and an US. This revealed some benign appearing soft tissue that was felt to represent a lipoma. The soreness went away and it has not bothered him until lately. Now the discomfort is on the left side. He has been taking an OTC supplement which contains cherry extract and celery seeds, but apparently nothing that could have a hormonal influence on his body.    Review of Systems  Constitutional: Negative.   Cardiovascular: Positive for chest pain.       Objective:   Physical Exam  Constitutional: He appears well-developed and well-nourished.  Pulmonary/Chest:  He has mild gynecomastia bilaterally. The left breast has a few small scattered cystic lumps that are tender. No nipple DC. No skin changes. No axillary nodes. The right breast is benign.           Assessment & Plan:  These feel like benign cysts but we will set up a mammogram and an US.

## 2014-01-31 ENCOUNTER — Ambulatory Visit
Admission: RE | Admit: 2014-01-31 | Discharge: 2014-01-31 | Disposition: A | Discharge status: Home / Self Care | Payer: 59 | Source: Ambulatory Visit | Attending: Family Medicine | Admitting: Family Medicine

## 2014-01-31 DIAGNOSIS — N63 Unspecified lump in unspecified breast: Secondary | ICD-10-CM

## 2015-01-22 ENCOUNTER — Telehealth: Payer: Self-pay | Admitting: Nurse Practitioner

## 2015-01-22 DIAGNOSIS — R3915 Urgency of urination: Secondary | ICD-10-CM

## 2015-01-22 MED ORDER — SULFAMETHOXAZOLE-TRIMETHOPRIM 800-160 MG PO TABS: 1.0000 | ORAL_TABLET | Freq: Two times a day (BID) | ORAL | Status: DC

## 2015-01-22 NOTE — Progress Notes (Signed)

## 2015-09-25 ENCOUNTER — Other Ambulatory Visit: Payer: Self-pay | Admitting: Nurse Practitioner

## 2015-10-05 ENCOUNTER — Telehealth: Payer: Self-pay | Admitting: Family

## 2015-10-05 DIAGNOSIS — R300 Dysuria: Secondary | ICD-10-CM

## 2015-10-05 NOTE — Progress Notes (Signed)
We are sorry that you are not feeling well.  Here is how we plan to help!  Male bladder infections are not very common.  We worry about prostate or kidney conditions.  The standard of care is to examine the abdomen and kidneys, and to do a urine and blood test to make sure that something more serious is not going on.  We recommend that you see a provider today.  If your doctor's office is closed [facility] has the following Urgent Cares:   . [facility]  [phone] Get Driving Directions Find a Provider at this Location  [address] [place], [place] [address] . 8 am to 8 pm Monday-Friday . 9 am to 7 pm Saturday-Sunday  . [facility]  [phone] Get Driving Directions Find a Provider at this Location  [address], [address] [place], [place] [address] . 8 am to 8 pm Monday-Friday . 9 am to 6 pm Saturday . 11 am to 6 pm Sunday  . [facility] [place]  [phone] Get Driving Directions  [address] [place], [place] [address] . 8 am to 8 pm Monday-Friday . 9 am to 4 pm Saturday-Sunday  . [facility] (a walk in primary care provider)  [phone]  Get Driving Directions Find a Provider at this Location  [address] [place], [place] [address] . 8 am to 8:30 pm Monday-Thursday . 8 am to 6 pm Friday . 8 am to 4 pm Saturday-Sunday  Your e-visit answers were reviewed by a board certified advanced clinical practitioner to complete your personal care plan.  Depending on the condition, your plan could have included both over the counter or prescription medications.  You will get an e-mail in the next two days asking about your experience.  I hope that your e-visit has been valuable and will speed your recovery. Thank you for using e-visits.    

## 2015-10-05 NOTE — Progress Notes (Signed)
Follow-up with a primary care or Urgent care office. UTIs are  typically not passed from male to male through intercourse.

## 2015-10-06 ENCOUNTER — Ambulatory Visit: Payer: Self-pay | Admitting: Family Medicine

## 2015-10-07 ENCOUNTER — Ambulatory Visit (INDEPENDENT_AMBULATORY_CARE_PROVIDER_SITE_OTHER): Payer: 59 | Admitting: Family Medicine

## 2015-10-07 ENCOUNTER — Encounter: Payer: Self-pay | Admitting: Family Medicine

## 2015-10-07 VITALS — BP 125/70 | HR 85 | Temp 99.2°F | Ht 71.0 in | Wt 206.0 lb

## 2015-10-07 DIAGNOSIS — N390 Urinary tract infection, site not specified: Secondary | ICD-10-CM | POA: Diagnosis not present

## 2015-10-07 DIAGNOSIS — R3915 Urgency of urination: Secondary | ICD-10-CM

## 2015-10-07 LAB — POC URINALSYSI DIPSTICK (AUTOMATED)
BILIRUBIN UA: NEGATIVE
Glucose, UA: NEGATIVE
Ketones, UA: NEGATIVE
NITRITE UA: NEGATIVE
PH UA: 6
Spec Grav, UA: 1.025
Urobilinogen, UA: 0.2

## 2015-10-07 MED ORDER — SULFAMETHOXAZOLE-TRIMETHOPRIM 800-160 MG PO TABS: 1.0000 | ORAL_TABLET | Freq: Two times a day (BID) | ORAL | Status: DC

## 2015-10-07 NOTE — Addendum Note (Signed)
Addended by: Aniceto BossNIMMONS, SYLVIA A on: 10/07/2015 04:18 PM   Modules accepted: Orders

## 2015-10-07 NOTE — Progress Notes (Signed)
   Subjective:    Patient ID: Ricardo Mitchell, male    DOB: 08-24-1970, 45 y.o.   MRN: [id number]  HPI Here for 3 weeks of intermittent urgency and frequency of urination, sometimes with a foul odor. No DC, no burning.    Review of Systems  Constitutional: Negative.   Genitourinary: Positive for urgency and frequency. Negative for dysuria, hematuria, flank pain, discharge and difficulty urinating.       Objective:   Physical Exam  Constitutional: He appears well-developed and well-nourished.  Abdominal: Soft. Bowel sounds are normal. He exhibits no distension and no mass. There is no tenderness. There is no rebound and no guarding.          Assessment & Plan:  UTI, treat with Bactrim DS. Culture the sample. Drink plenty of water.  Nelwyn SalisburyFRY,Maizie Garno A, MD

## 2015-10-07 NOTE — Addendum Note (Signed)
Addended by: Bonnye FavaKWEI, Kamaria Lucia K on: 10/07/2015 04:45 PM   Modules accepted: Orders

## 2015-10-07 NOTE — Progress Notes (Signed)
Pre visit review using our clinic review tool, if applicable. No additional management support is needed unless otherwise documented below in the visit note. 

## 2015-10-09 LAB — URINE CULTURE
Colony Count: NO GROWTH
ORGANISM ID, BACTERIA: NO GROWTH

## 2015-11-07 IMAGING — CT CT MAXILLOFACIAL W/O CM
3 series · 16 of 47 positions shown, 19 images · non-contrast
Comparison: 10/21/2006

CLINICAL DATA: Jaw pain

EXAM:
CT MAXILLOFACIAL WITHOUT CONTRAST
TECHNIQUE: Multidetector CT imaging of the maxillofacial structures was
performed. Multiplanar CT image reconstructions were also generated.
A small metallic BB was placed on the right temple in order to
reliably differentiate right from left.

[Series 2: facial/ orbits 2.0 h30s · axial · 0.37mm/px · z∈[+257,+407]mm · 10 of 89 slices shown, 13 images]
[im 7/89  brain]
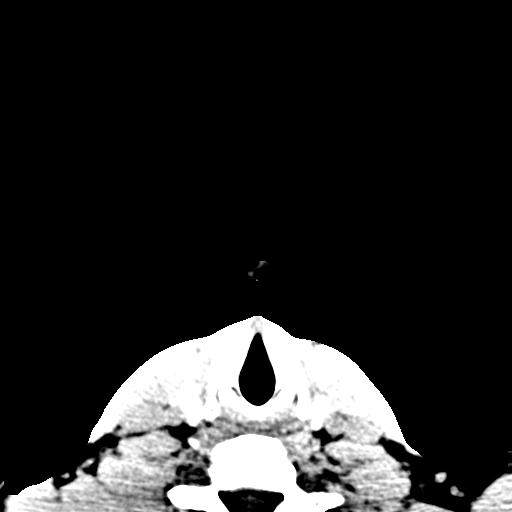
[im 7/89  bone]
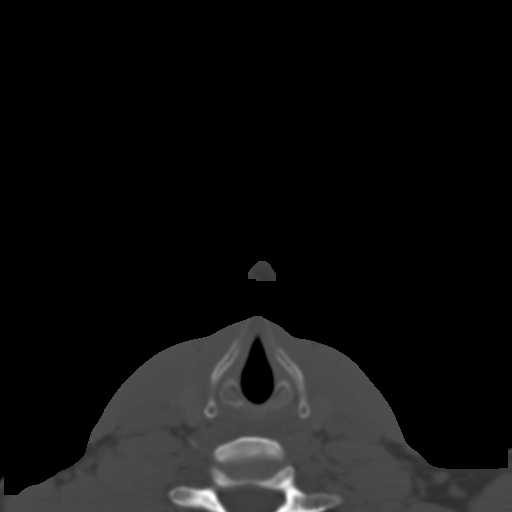
[im 16/89  bone]
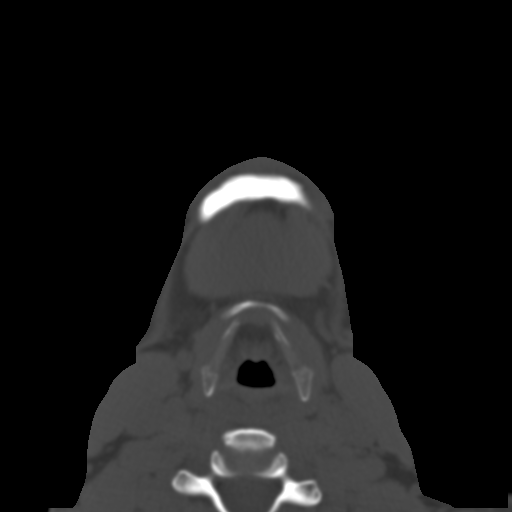
[im 25/89  bone]
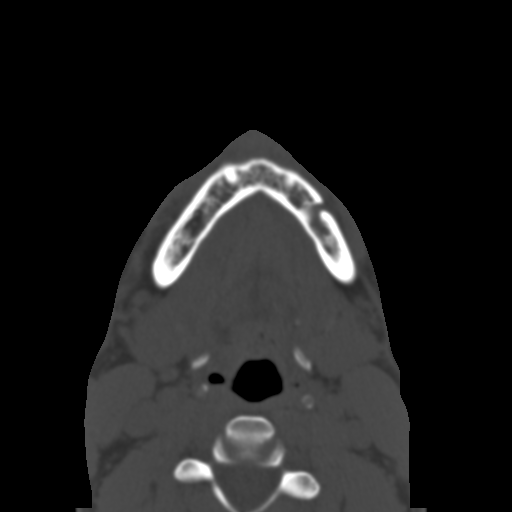
[im 31/89  bone]
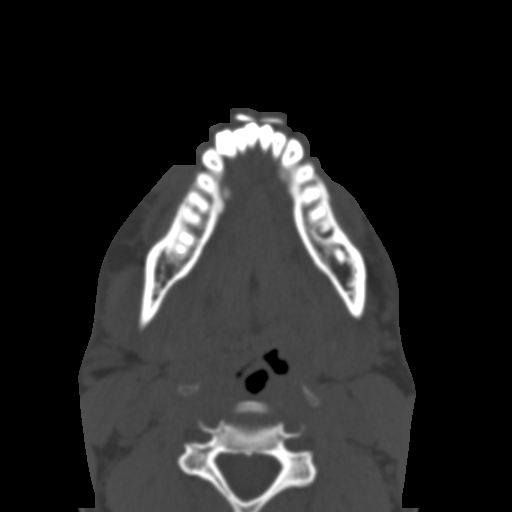
[im 40/89  brain]
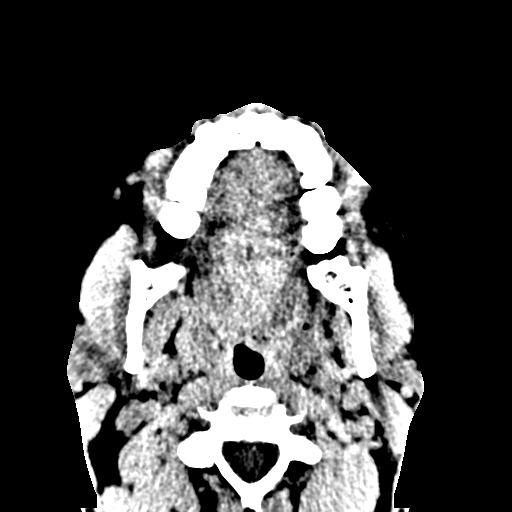
[im 40/89  bone]
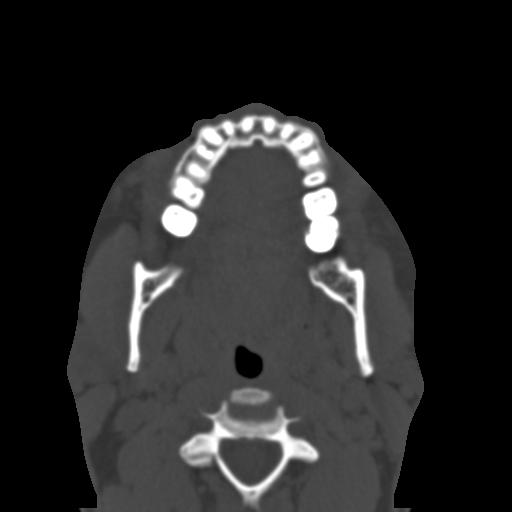
[im 49/89  bone]
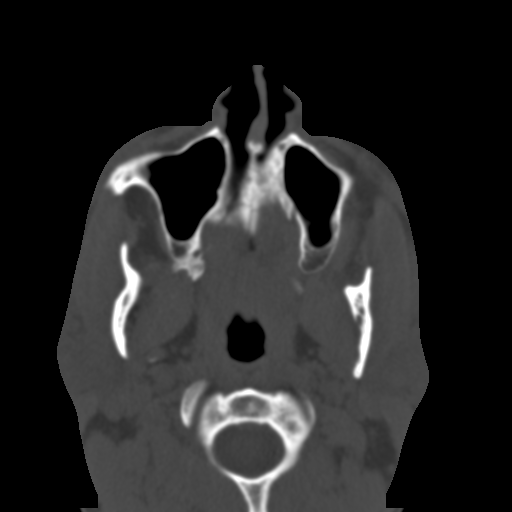
[im 58/89  bone]
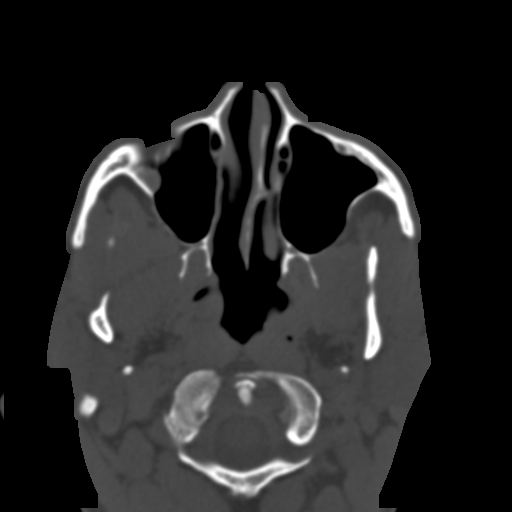
[im 67/89  bone]
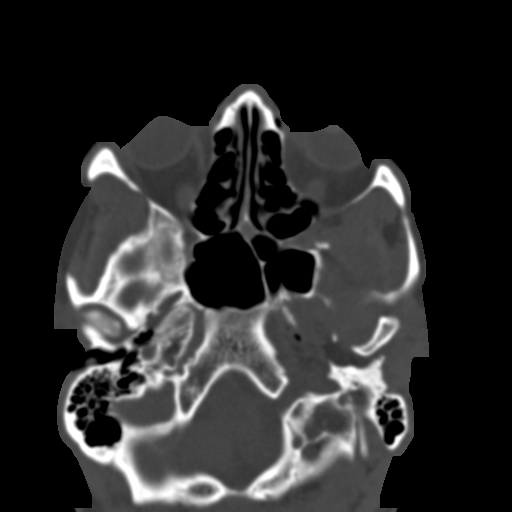
[im 73/89  brain]
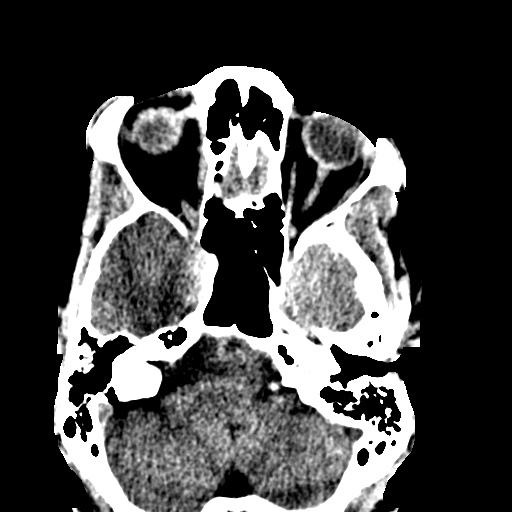
[im 73/89  bone]
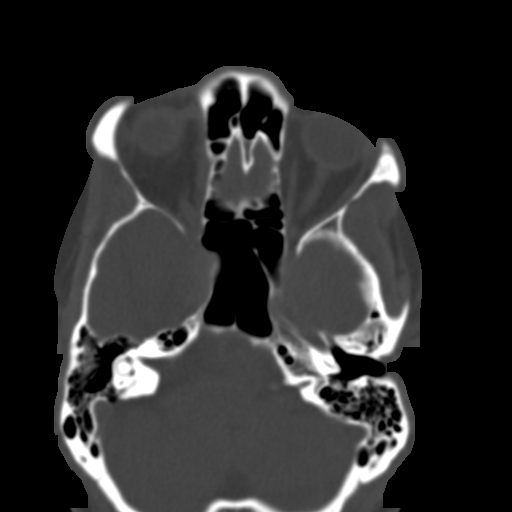
[im 82/89  bone]
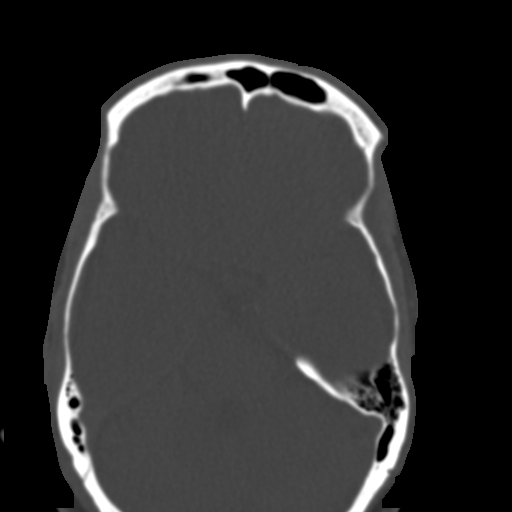

[Series 4: coronal st · coronal · 0.37mm/px · 3 of 76 slices shown]
[im 26/76  bone]
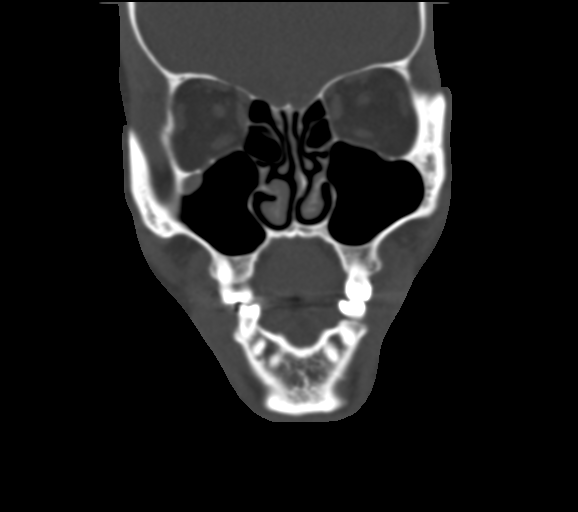
[im 34/76  bone]
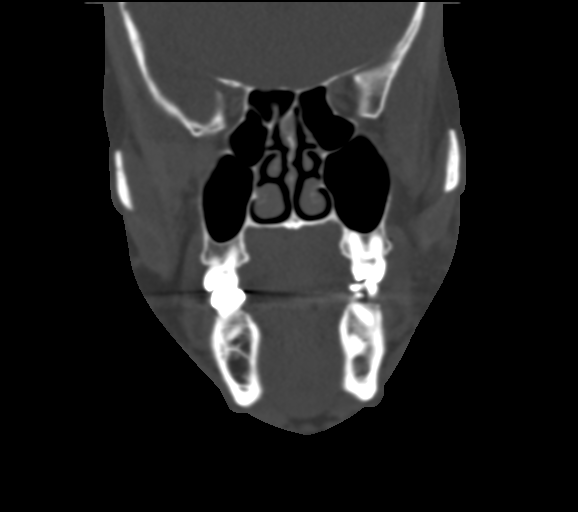
[im 42/76  bone]
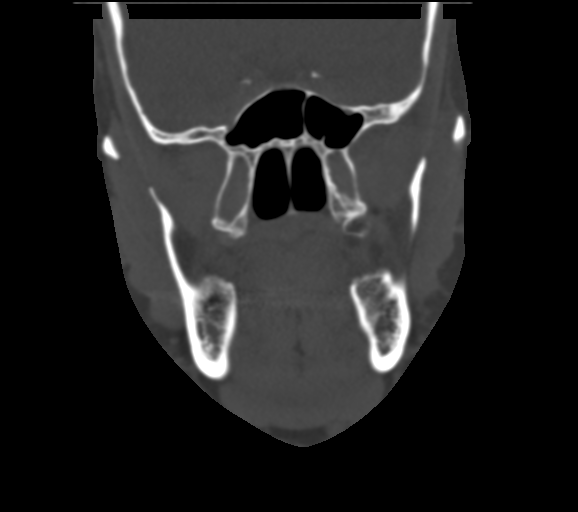

[Series 5: sagittal st · sagittal · 0.36mm/px · 3 of 89 slices shown]
[im 30/89  bone]
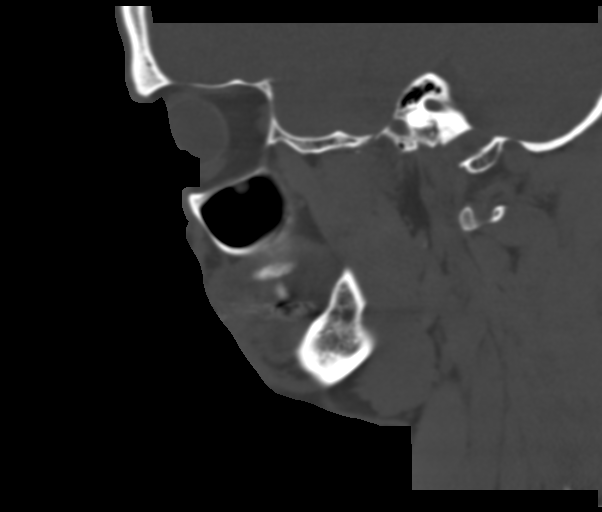
[im 45/89  bone]
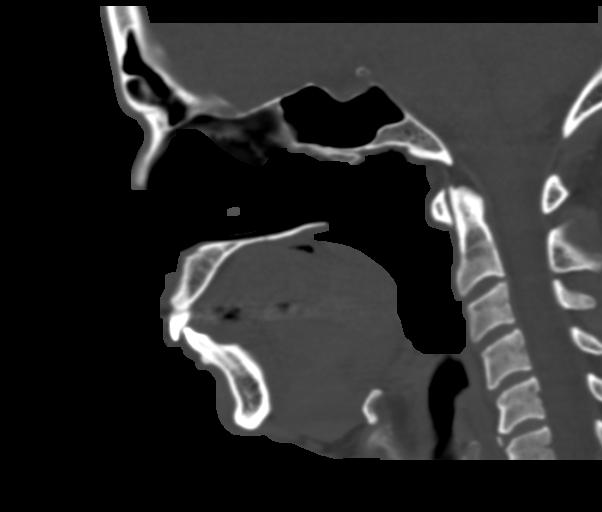
[im 59/89  bone]
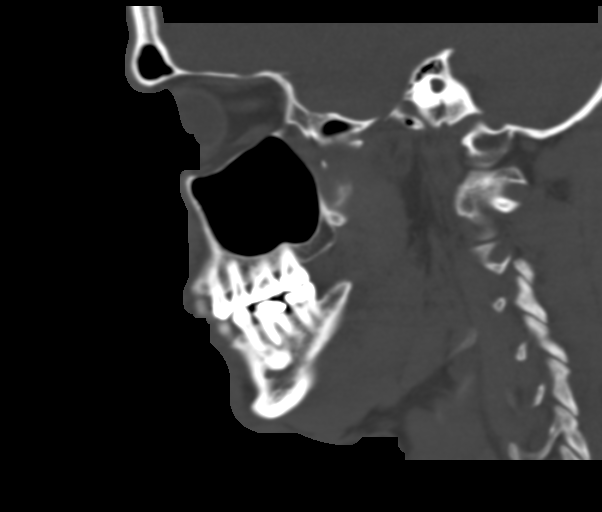

[16 of 47 positions shown; findings below may reference images not displayed]

FINDINGS: The mandible is intact. The temporomandibular joints are located. No
evidence for maxillary sinus fracture. The nasal bones are intact.
The zygomatic arches are normal. No air-fluid levels in the
paranasal sinuses. No fluid in the mastoid air cells or middle ears.
Small focus of polypoid mucosal disease is identified in the right
maxillary sinus.

The medial and inferior orbital walls are intact.
IMPRESSION: No acute findings. No evidence to explain the patient's history of
jaw pain.

## 2015-12-21 ENCOUNTER — Telehealth: Payer: 59 | Admitting: Family

## 2015-12-21 DIAGNOSIS — N390 Urinary tract infection, site not specified: Secondary | ICD-10-CM

## 2015-12-21 NOTE — Progress Notes (Signed)
We are sorry that you are not feeling well.  Here is how we plan to help!  Male bladder infections are not very common.  We worry about prostate or kidney conditions.  The standard of care is to examine the abdomen and kidneys, and to do a urine and blood test to make sure that something more serious is not going on.  We recommend that you see a provider today.  If your doctor's office is closed [facility] has the following Urgent Cares:   . [facility]  [phone] Get Driving Directions Find a Provider at this Location  [address] [place], [place] [address] . 8 am to 8 pm Monday-Friday . 9 am to 7 pm Saturday-Sunday  . [facility]  [phone] Get Driving Directions Find a Provider at this Location  [address], [address] [place], [place] [address] . 8 am to 8 pm Monday-Friday . 9 am to 6 pm Saturday . 11 am to 6 pm Sunday  . [facility]  [phone] Get Driving Directions  [address] [place], [place] [address] . 8 am to 8 pm Monday-Friday . 9 am to 4 pm Saturday-Sunday  . [facility] (a walk in primary care provider)  [phone]  Get Driving Directions Find a Provider at this Location  [address] [place],  [address] . 8 am to 8:30 pm Monday-Thursday . 8 am to 6 pm Friday . 8 am to 4 pm Saturday-Sunday  Your e-visit answers were reviewed by a board certified advanced clinical practitioner to complete your personal care plan.  Depending on the condition, your plan could have included both over the counter or prescription medications.  You will get an e-mail in the next two days asking about your experience.  I hope that your e-visit has been valuable and will speed your recovery. Thank you for using e-visits.    

## 2015-12-22 ENCOUNTER — Other Ambulatory Visit: Payer: Self-pay | Admitting: Family Medicine

## 2015-12-22 DIAGNOSIS — R3915 Urgency of urination: Secondary | ICD-10-CM

## 2015-12-24 NOTE — Telephone Encounter (Signed)
I called the pt and advised him a request was received for a refill on Bactrim and he would need an appointment to be seen first.  He stated he does not need a refill on this and was "just clicking buttons and clicked on this by accident".

## 2016-01-07 ENCOUNTER — Encounter: Payer: Self-pay | Admitting: Family Medicine

## 2016-01-07 ENCOUNTER — Ambulatory Visit: Payer: Self-pay | Admitting: Family Medicine

## 2016-01-07 ENCOUNTER — Ambulatory Visit (INDEPENDENT_AMBULATORY_CARE_PROVIDER_SITE_OTHER): Payer: 59 | Admitting: Family Medicine

## 2016-01-07 VITALS — BP 122/74 | HR 90 | Temp 99.5°F | Ht 71.0 in | Wt 201.0 lb

## 2016-01-07 DIAGNOSIS — M79672 Pain in left foot: Secondary | ICD-10-CM | POA: Diagnosis not present

## 2016-01-07 DIAGNOSIS — R3989 Other symptoms and signs involving the genitourinary system: Secondary | ICD-10-CM | POA: Diagnosis not present

## 2016-01-07 LAB — POCT URINALYSIS DIPSTICK
BILIRUBIN UA: NEGATIVE
Glucose, UA: NEGATIVE
Ketones, UA: NEGATIVE
LEUKOCYTES UA: NEGATIVE
NITRITE UA: NEGATIVE
PH UA: 6
Protein, UA: NEGATIVE
Spec Grav, UA: 1.015
Urobilinogen, UA: 0.2

## 2016-01-07 LAB — URINALYSIS, MICROSCOPIC ONLY: RBC / HPF: NONE SEEN (ref 0–?)

## 2016-01-07 NOTE — Progress Notes (Addendum)
HPI:  Ricardo Mitchell is a pleasant 45 yo whom I have not seen in sometime here for a complaint of chang ein urine color. Appears he saw Dr. Clent RidgesFry in the past for a UTI and has a remote hx of hematuria - referred to urologist in 2014. Reports he had extensive eval with urologist - all normal. Reports no symptoms, but for several weeks notices urine is slighlty more yellow in the morning on his first void then the rest of the day.No hematuria, fevers, malaise, penile discharge, dysuria, back or flank pain, abd pain. He also has so L palntar foot pain since removing carbeting in his how a few weeks ago. Only feels it from time to time when first gets up. Better with change in shoes. Denies weaknness, numbness, injury.  ROS: See pertinent positives and negatives per HPI.  No past medical history on file.  No past surgical history on file.  Family History  Problem Relation Age of Onset  . Heart attack Father     Social History   Social History  . Marital Status: Single    Spouse Name: N/A  . Number of Children: N/A  . Years of Education: N/A   Social History Main Topics  . Smoking status: Never Smoker   . Smokeless tobacco: Never Used  . Alcohol Use: 0.0 oz/week    0 Standard drinks or equivalent per week     Comment: occassionally  . Drug Use: No  . Sexual Activity: Not Asked   Other Topics Concern  . None   Social History Narrative    No current outpatient prescriptions on file.  EXAM:  Filed Vitals:   01/07/16 1017  BP: 122/74  Pulse: 90  Temp: 99.5 F (37.5 C)    Body mass index is 28.05 kg/(m^2).  GENERAL: vitals reviewed and listed above, alert, oriented, appears well hydrated and in no acute distress  HEENT: atraumatic, conjunttiva clear, no obvious abnormalities on inspection of external nose and ears  NECK: no obvious masses on inspection  LUNGS: clear to auscultation bilaterally, no wheezes, rales or rhonchi, good air movement  ABD: soft, NTTP, no  CVA TTP  CV: HRRR, no peripheral edema  MS: moves all extremities without noticeable abnormality, normal inspection feet except pes planus, mild TTP in L plantar fascia, unremarkable exam of feet o/w  PSYCH: pleasant and cooperative, no obvious depression or anxiety  ASSESSMENT AND PLAN:  Discussed the following assessment and plan:  Abnormal urine color - Plan: POC Urinalysis Dipstick -look normal, tr hemoglobin, will get urine micro as our urine dips in office notoriously have high rate of tr blood -advise he follow up with urology if recurrent issues or new symptoms  Foot pain, left -suspect plantar fasciitis  -Patient advised to return or notify a doctor immediately if symptoms worsen or persist or new concerns arise.  Patient Instructions  BEFORE YOU LEAVE: -follow up: schedule physical in the next 2-3 months  Stretching and strassburg sock for the foot pain. Follow up if persists.  We have ordered labs or studies at this visit. It can take up to 1-2 weeks for results and processing. IF results require follow up or explanation, we will call you with instructions. Clinically stable results will be released to your [facility]. If you have not heard from us or cannot find your results in [facility] in 2 weeks please contact our office at [phone].  If you are not yet signed up for [facility], please consider signing up.  Colin Benton R., DO

## 2016-01-07 NOTE — Progress Notes (Signed)
Pre visit review using our clinic review tool, if applicable. No additional management support is needed unless otherwise documented below in the visit note. 

## 2016-01-07 NOTE — Patient Instructions (Addendum)
BEFORE YOU LEAVE: -follow up: schedule physical in the next 2-3 months  Stretching and strassburg sock for the foot pain. Follow up if persists.  We have ordered labs or studies at this visit. It can take up to 1-2 weeks for results and processing. IF results require follow up or explanation, we will call you with instructions. Clinically stable results will be released to your [facility]. If you have not heard from us or cannot find your results in [facility] in 2 weeks please contact our office at [phone].  If you are not yet signed up for [facility], please consider signing up.

## 2016-01-07 NOTE — Addendum Note (Signed)
Addended by: Johnella MoloneyFUNDERBURK, Clarita Mcelvain A on: 01/07/2016 10:48 AM   Modules accepted: Orders

## 2016-03-08 ENCOUNTER — Other Ambulatory Visit: Payer: 59

## 2016-03-15 ENCOUNTER — Encounter: Payer: 59 | Admitting: Family Medicine

## 2016-10-03 ENCOUNTER — Ambulatory Visit (INDEPENDENT_AMBULATORY_CARE_PROVIDER_SITE_OTHER): Payer: Managed Care, Other (non HMO) | Admitting: Family Medicine

## 2016-10-03 ENCOUNTER — Other Ambulatory Visit (HOSPITAL_COMMUNITY)
Admission: RE | Admit: 2016-10-03 | Discharge: 2016-10-03 | Disposition: A | Discharge status: Home / Self Care | Payer: Managed Care, Other (non HMO) | Source: Ambulatory Visit | Attending: Family Medicine | Admitting: Family Medicine

## 2016-10-03 ENCOUNTER — Encounter: Payer: Self-pay | Admitting: Family Medicine

## 2016-10-03 VITALS — BP 120/80 | HR 93 | Temp 98.2°F | Ht 71.0 in | Wt 216.0 lb

## 2016-10-03 DIAGNOSIS — Z113 Encounter for screening for infections with a predominantly sexual mode of transmission: Secondary | ICD-10-CM

## 2016-10-03 DIAGNOSIS — R350 Frequency of micturition: Secondary | ICD-10-CM | POA: Diagnosis not present

## 2016-10-03 LAB — POCT URINALYSIS DIPSTICK
BILIRUBIN UA: NEGATIVE
Glucose, UA: NEGATIVE
Ketones, UA: NEGATIVE
Leukocytes, UA: NEGATIVE
Nitrite, UA: NEGATIVE
Protein, UA: NEGATIVE
RBC UA: NEGATIVE
SPEC GRAV UA: 1.02 (ref 1.010–1.025)
Urobilinogen, UA: 0.2 E.U./dL
pH, UA: 7.5 (ref 5.0–8.0)

## 2016-10-03 NOTE — Progress Notes (Signed)
  HPI:  Acute visit for urinary frequency: -very mild -however, started after reported condom broke while having anal sex with girlfriend -not a new partner but wants to check for UTI -agrees to HIV, GC/Chlam testing as well - does not want penile probe but agrees to urine testing -denies: dysuria, hematuria, penile discharge, penile pain or rash, fevers, malaise, flank pain, nausea, vomiting  ROS: See pertinent positives and negatives per HPI.  No past medical history on file.  No past surgical history on file.  Family History  Problem Relation Age of Onset  . Heart attack Father     Social History   Social History  . Marital status: Single    Spouse name: N/A  . Number of children: N/A  . Years of education: N/A   Social History Main Topics  . Smoking status: Never Smoker  . Smokeless tobacco: Never Used  . Alcohol use 0.0 oz/week     Comment: occassionally  . Drug use: No  . Sexual activity: Not Asked   Other Topics Concern  . None   Social History Narrative  . None    No current outpatient prescriptions on file.  EXAM:  Vitals:   10/03/16 1328  BP: 120/80  Pulse: 93  Temp: 98.2 F (36.8 C)    Body mass index is 30.13 kg/m.  GENERAL: vitals reviewed and listed above, alert, oriented, appears well hydrated and in no acute distress  HEENT: atraumatic, conjunttiva clear, no obvious abnormalities on inspection of external nose and ears  NECK: no obvious masses on inspection  LUNGS: clear to auscultation bilaterally, no wheezes, rales or rhonchi, good air movement  CV: HRRR, no peripheral edema  ABD: no CVA TTP  MS: moves all extremities without noticeable abnormality  PSYCH: pleasant and cooperative, no obvious depression or anxiety  ASSESSMENT AND PLAN:  Discussed the following assessment and plan:  Urinary frequency  Routine screening for STI (sexually transmitted infection) - Plan: HIV antibody  -udip, GC/CHlam/Trich urine, HIV  testing pending -Patient advised to return or notify a doctor immediately if symptoms worsen or persist or new concerns arise.  Patient Instructions  BEFORE YOU LEAVE: -lab for blood work  We have ordered labs or studies at this visit. It can take up to 1-2 weeks for results and processing. IF results require follow up or explanation, we will call you with instructions. Clinically stable results will be released to your [facility]. If you have not heard from [place] or cannot find your results in [facility] in 2 weeks please contact our office at [phone].  If you are not yet signed up for [facility], please consider signing up.  I hope you are feeling better soon! Seek care immediately if worsening, new concerns or you are not improving.  WE NOW OFFER   [facility] FAST TRACK!!!  SAME DAY Appointments for ACUTE CARE  Such as: Sprains, Injuries, cuts, abrasions, rashes, muscle pain, joint pain, back pain Colds, flu, sore throats, headache, allergies, cough, fever  Ear pain, sinus and eye infections Abdominal pain, nausea, vomiting, diarrhea, upset stomach Animal/insect bites  3 Easy Ways to Schedule: Walk-In Scheduling Call in scheduling Mychart Sign-up: [email or web address]             Kriste Basque R., DO

## 2016-10-03 NOTE — Patient Instructions (Signed)
BEFORE YOU LEAVE: -lab for blood work  We have ordered labs or studies at this visit. It can take up to 1-2 weeks for results and processing. IF results require follow up or explanation, we will call you with instructions. Clinically stable results will be released to your [facility]. If you have not heard from [place] or cannot find your results in [facility] in 2 weeks please contact our office at [phone].  If you are not yet signed up for [facility], please consider signing up.  I hope you are feeling better soon! Seek care immediately if worsening, new concerns or you are not improving.  WE NOW OFFER   [facility] FAST TRACK!!!  SAME DAY Appointments for ACUTE CARE  Such as: Sprains, Injuries, cuts, abrasions, rashes, muscle pain, joint pain, back pain Colds, flu, sore throats, headache, allergies, cough, fever  Ear pain, sinus and eye infections Abdominal pain, nausea, vomiting, diarrhea, upset stomach Animal/insect bites  3 Easy Ways to Schedule: Walk-In Scheduling Call in scheduling Mychart Sign-up: [email or web address]

## 2016-10-03 NOTE — Progress Notes (Signed)
Pre visit review using our clinic review tool, if applicable. No additional management support is needed unless otherwise documented below in the visit note. 

## 2016-10-04 LAB — HIV ANTIBODY (ROUTINE TESTING W REFLEX): HIV 1&2 Ab, 4th Generation: NONREACTIVE

## 2016-10-05 LAB — URINE CYTOLOGY ANCILLARY ONLY
Chlamydia: NEGATIVE
Neisseria Gonorrhea: NEGATIVE
TRICH (WINDOWPATH): NEGATIVE

## 2016-12-19 ENCOUNTER — Ambulatory Visit (INDEPENDENT_AMBULATORY_CARE_PROVIDER_SITE_OTHER): Payer: Managed Care, Other (non HMO) | Admitting: Family Medicine

## 2016-12-19 ENCOUNTER — Encounter: Payer: Self-pay | Admitting: Family Medicine

## 2016-12-19 VITALS — BP 128/82 | HR 85 | Temp 98.7°F | Ht 71.0 in | Wt 209.2 lb

## 2016-12-19 DIAGNOSIS — R030 Elevated blood-pressure reading, without diagnosis of hypertension: Secondary | ICD-10-CM | POA: Diagnosis not present

## 2016-12-19 DIAGNOSIS — R300 Dysuria: Secondary | ICD-10-CM | POA: Diagnosis not present

## 2016-12-19 DIAGNOSIS — R3129 Other microscopic hematuria: Secondary | ICD-10-CM | POA: Diagnosis not present

## 2016-12-19 DIAGNOSIS — R350 Frequency of micturition: Secondary | ICD-10-CM | POA: Diagnosis not present

## 2016-12-19 LAB — BASIC METABOLIC PANEL
BUN: 13 mg/dL (ref 6–23)
CO2: 30 mEq/L (ref 19–32)
Calcium: 9.3 mg/dL (ref 8.4–10.5)
Chloride: 102 mEq/L (ref 96–112)
Creatinine, Ser: 1.24 mg/dL (ref 0.40–1.50)
GFR: 80.02 mL/min (ref >60.00)
Glucose, Bld: 100 mg/dL — ABNORMAL HIGH (ref 70–99)
Potassium: 4.4 mEq/L (ref 3.5–5.1)
Sodium: 138 mEq/L (ref 135–145)

## 2016-12-19 LAB — POCT URINALYSIS DIPSTICK
Bilirubin, UA: NEGATIVE
Glucose, UA: NEGATIVE
Leukocytes, UA: NEGATIVE
Nitrite, UA: NEGATIVE
SPEC GRAV UA: 1.02 (ref 1.010–1.025)
Urobilinogen, UA: 0.2 E.U./dL
pH, UA: 7 (ref 5.0–8.0)

## 2016-12-19 LAB — URINALYSIS, MICROSCOPIC ONLY
RBC / HPF: NONE SEEN (ref 0–?)
WBC, UA: NONE SEEN (ref 0–?)

## 2016-12-19 LAB — HEMOGLOBIN A1C: HEMOGLOBIN A1C: 5.7 % (ref 4.6–6.5)

## 2016-12-19 NOTE — Progress Notes (Signed)
  HPI:  Acute visit for Dysuria: -long hx of this and reported remote hx eval with urologist -reports: urinary urgency, frequency and bladder discomfort when urinartes intermittently for 2 weeks -denies:pain, fevers, malaise, burning, penile discharge, rectal pain or discomfort, weak stream, concern for STI, new partners -he wants to check for diabetes -he refuses a DRE exam  ROS: See pertinent positives and negatives per HPI.  No past medical history on file.  No past surgical history on file.  Family History  Problem Relation Age of Onset  . Heart attack Father     Social History   Social History  . Marital status: Single    Spouse name: N/A  . Number of children: N/A  . Years of education: N/A   Social History Main Topics  . Smoking status: Never Smoker  . Smokeless tobacco: Never Used  . Alcohol use 0.0 oz/week     Comment: occassionally  . Drug use: No  . Sexual activity: Not Asked   Other Topics Concern  . None   Social History Narrative  . None    No current outpatient prescriptions on file.  EXAM:  Vitals:   12/19/16 1316 12/19/16 1339  BP: 140/90 128/82  Pulse: 85   Temp: 98.7 F (37.1 C)     Body mass index is 29.18 kg/m.  GENERAL: vitals reviewed and listed above, alert, oriented, appears well hydrated and in no acute distress  HEENT: atraumatic, conjunttiva clear, no obvious abnormalities on inspection of external nose and ears  NECK: no obvious masses on inspection  LUNGS: clear to auscultation bilaterally, no wheezes, rales or rhonchi, good air movement  CV: HRRR, no peripheral edema  ABD: soft, NTTP  GU/DRE: advised -  refused  MS: moves all extremities without noticeable abnormality  PSYCH: pleasant and cooperative, no obvious depression or anxiety  ASSESSMENT AND PLAN:  Discussed the following assessment and plan:  Dysuria - Plan: POC Urinalysis Dipstick, Urine Microscopic Only, Culture, Urine  FREQUENCY, URINARY -  Plan: Basic metabolic panel, Hemoglobin A1c, POC Urinalysis Dipstick, Urine Microscopic Only, Culture, Urine  Elevated BP without diagnosis of hypertension  Microscopic hematuria  -we discussed possible serious and likely etiologies, workup and treatment, treatment risks and return precautions -udip with tr blood - will get micro and culture, hgba1c, bmp -given recurrent ongoing issues would advise urology evaluation if further testing urevealing and he agrees - he refused prostate/gu exam here today -Patient advised to return or notify a doctor immediately if symptoms worsen or persist or new concerns arise.  Patient Instructions  BEFORE YOU LEAVE: -BP recheck -labs -follow up: Physical in 2-3 months - come fasting  We have ordered labs or studies at this visit. It can take up to 1-2 weeks for results and processing. IF results require follow up or explanation, we will call you with instructions. Clinically stable results will be released to your [facility]. If you have not heard from us or cannot find your results in [facility] in 2 weeks please contact our office at [phone].  If you are not yet signed up for [facility], please consider signing up.  Drink plenty of water and eat a healthy diet.  If the other labs do not explain your symptoms we will place a referral to urology.  I hope you are feeling better soon! Seek care immediately if worsening or new concerns in the interim.          Kriste BasqueKIM, Brad Mcgaughy R., DO

## 2016-12-19 NOTE — Patient Instructions (Addendum)
BEFORE YOU LEAVE: -BP recheck -labs -follow up: Physical in 2-3 months - come fasting  We have ordered labs or studies at this visit. It can take up to 1-2 weeks for results and processing. IF results require follow up or explanation, we will call you with instructions. Clinically stable results will be released to your [facility]. If you have not heard from us or cannot find your results in [facility] in 2 weeks please contact our office at [phone].  If you are not yet signed up for [facility], please consider signing up.  Drink plenty of water and eat a healthy diet.  If the other labs do not explain your symptoms we will place a referral to urology.  I hope you are feeling better soon! Seek care immediately if worsening or new concerns in the interim.

## 2016-12-21 LAB — URINE CULTURE: Organism ID, Bacteria: NO GROWTH

## 2017-01-16 ENCOUNTER — Ambulatory Visit: Payer: Self-pay | Admitting: Family Medicine

## 2017-01-16 ENCOUNTER — Encounter: Payer: Managed Care, Other (non HMO) | Admitting: Family Medicine

## 2017-03-09 ENCOUNTER — Encounter: Payer: Self-pay | Admitting: Family Medicine

## 2017-04-06 ENCOUNTER — Encounter: Payer: Self-pay | Admitting: Family Medicine

## 2017-04-06 ENCOUNTER — Ambulatory Visit (INDEPENDENT_AMBULATORY_CARE_PROVIDER_SITE_OTHER): Payer: Managed Care, Other (non HMO) | Admitting: Family Medicine

## 2017-04-06 VITALS — BP 120/80 | HR 90 | Temp 98.6°F | Ht 71.0 in | Wt 206.8 lb

## 2017-04-06 DIAGNOSIS — N281 Cyst of kidney, acquired: Secondary | ICD-10-CM

## 2017-04-06 DIAGNOSIS — M25571 Pain in right ankle and joints of right foot: Secondary | ICD-10-CM | POA: Diagnosis not present

## 2017-04-06 NOTE — Patient Instructions (Signed)
BEFORE YOU LEAVE: -follow up: 1 month  Ice  Do the alphabet exercises  Wear the supportive shoes  Follow up sooner if worsening or other concerns

## 2017-04-06 NOTE — Progress Notes (Signed)
  HPI:  Lynelle Doctornthony R Allbee Is a pleasant 46 year old here for an acute visit for ankle pain: -Reports started about 1-2 months ago when he was doing a silly dance move -Has had mild pain in the posterior medial right ankle since that occurs mainly when he walks barefoot around his house -Denies redness, swelling, bruising, catching, clicking, weakness, numbness or skin rash  Reports he is seeing his urologist, Dr. Berneice HeinrichManny, for a renal cyst that they are monitoring with imaging.  ROS: See pertinent positives and negatives per HPI.  No past medical history on file.  No past surgical history on file.  Family History  Problem Relation Age of Onset  . Heart attack Father     Social History   Social History  . Marital status: Single    Spouse name: N/A  . Number of children: N/A  . Years of education: N/A   Social History Main Topics  . Smoking status: Never Smoker  . Smokeless tobacco: Never Used  . Alcohol use 0.0 oz/week     Comment: occassionally  . Drug use: No  . Sexual activity: Not Asked   Other Topics Concern  . None   Social History Narrative  . None    No current outpatient prescriptions on file.  EXAM:  Vitals:   04/06/17 0902  BP: 120/80  Pulse: 90  Temp: 98.6 F (37 C)    Body mass index is 28.84 kg/m.  GENERAL: vitals reviewed and listed above, alert, oriented, appears well hydrated and in no acute distress  HEENT: atraumatic, conjunttiva clear, no obvious abnormalities on inspection of external nose and ears  NECK: no obvious masses on inspection  LUNGS: clear to auscultation bilaterally, no wheezes, rales or rhonchi, good air movement  CV: HRRR, no peripheral edema  MS: moves all extremities without noticeable abnormality, normal inspection of the ankles feet and lower legs for the most part, no swelling/redness or deformity, he has some mild tenderness to palpation of the medial right ankle tendons behind the malleolus, dorsiflexion w/  inversion against resistance causes some pain here, radiating superiorly and posteriorly, neurovascularly intact distally, negative drawer and talar tilt testing  PSYCH: pleasant and cooperative, no obvious depression or anxiety  ASSESSMENT AND PLAN:  Discussed the following assessment and plan:  Acute right ankle pain -discussed potential etiologies and suspect a mild tendinitis -He does not feel that he needs any medication for pain so we will treat conservatively with ice, supportive foot wear and also alphabet exercises -Follow up in 1 month  Cyst of right kidney -Seeing neurology for management, updated problem list  -Patient advised to return or notify a doctor immediately if symptoms worsen or persist or new concerns arise.  Patient Instructions  BEFORE YOU LEAVE: -follow up: 1 month  Ice  Do the alphabet exercises  Wear the supportive shoes  Follow up sooner if worsening or other concerns    Kriste BasqueKIM, HANNAH R., DO

## 2017-06-29 ENCOUNTER — Encounter: Payer: Self-pay | Admitting: Family Medicine
